# Patient Record
Sex: Male | Born: 1985 | Race: White | Hispanic: No | Marital: Single | Smoking: Current every day smoker
Health system: Southern US, Community
[De-identification: ages and names within clinical notes are randomized; demographics above are authoritative.]

## PROBLEM LIST (undated history)

## (undated) DIAGNOSIS — G40909 Epilepsy, unspecified, not intractable, without status epilepticus: Secondary | ICD-10-CM

## (undated) DIAGNOSIS — F909 Attention-deficit hyperactivity disorder, unspecified type: Secondary | ICD-10-CM

## (undated) DIAGNOSIS — F319 Bipolar disorder, unspecified: Secondary | ICD-10-CM

## (undated) DIAGNOSIS — J45909 Unspecified asthma, uncomplicated: Secondary | ICD-10-CM

## (undated) DIAGNOSIS — F639 Impulse disorder, unspecified: Secondary | ICD-10-CM

## (undated) HISTORY — PX: NO PAST SURGERIES: SHX2092

---
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
## 2006-05-02 ENCOUNTER — Ambulatory Visit: Payer: Self-pay | Admitting: Family Medicine

## 2006-10-30 DIAGNOSIS — F909 Attention-deficit hyperactivity disorder, unspecified type: Secondary | ICD-10-CM | POA: Insufficient documentation

## 2006-10-30 DIAGNOSIS — R569 Unspecified convulsions: Secondary | ICD-10-CM

## 2007-03-08 ENCOUNTER — Emergency Department (HOSPITAL_COMMUNITY): Admission: EM | Admit: 2007-03-08 | Discharge: 2007-03-08 | Payer: Self-pay | Admitting: Emergency Medicine

## 2007-04-13 ENCOUNTER — Emergency Department (HOSPITAL_COMMUNITY): Admission: EM | Admit: 2007-04-13 | Discharge: 2007-04-13 | Payer: Self-pay | Admitting: Emergency Medicine

## 2007-06-15 ENCOUNTER — Telehealth (INDEPENDENT_AMBULATORY_CARE_PROVIDER_SITE_OTHER): Payer: Self-pay | Admitting: *Deleted

## 2007-06-15 ENCOUNTER — Ambulatory Visit: Payer: Self-pay | Admitting: Family Medicine

## 2007-06-15 DIAGNOSIS — M25569 Pain in unspecified knee: Secondary | ICD-10-CM

## 2007-06-15 DIAGNOSIS — J301 Allergic rhinitis due to pollen: Secondary | ICD-10-CM | POA: Insufficient documentation

## 2007-12-05 ENCOUNTER — Emergency Department (HOSPITAL_COMMUNITY): Admission: EM | Admit: 2007-12-05 | Discharge: 2007-12-06 | Payer: Self-pay | Admitting: Emergency Medicine

## 2008-04-06 ENCOUNTER — Emergency Department (HOSPITAL_COMMUNITY): Admission: EM | Admit: 2008-04-06 | Discharge: 2008-04-07 | Payer: Self-pay | Admitting: Emergency Medicine

## 2008-06-15 ENCOUNTER — Emergency Department (HOSPITAL_COMMUNITY): Admission: EM | Admit: 2008-06-15 | Discharge: 2008-06-16 | Payer: Self-pay | Admitting: *Deleted

## 2009-05-20 ENCOUNTER — Encounter (INDEPENDENT_AMBULATORY_CARE_PROVIDER_SITE_OTHER): Payer: Self-pay | Admitting: *Deleted

## 2009-05-20 DIAGNOSIS — F17200 Nicotine dependence, unspecified, uncomplicated: Secondary | ICD-10-CM | POA: Insufficient documentation

## 2010-07-18 ENCOUNTER — Encounter: Payer: Self-pay | Admitting: Family Medicine

## 2010-10-02 NOTE — Miscellaneous (Signed)
  Clinical Lists Changes  Problems: Removed problem of ASTHMA, EXTRINSIC NOS (ICD-493.00)

## 2011-03-19 ENCOUNTER — Emergency Department (HOSPITAL_COMMUNITY)
Admission: EM | Admit: 2011-03-19 | Discharge: 2011-03-20 | Disposition: A | Discharge status: Home / Self Care | Payer: Self-pay | Attending: Emergency Medicine | Admitting: Emergency Medicine

## 2011-03-19 DIAGNOSIS — R112 Nausea with vomiting, unspecified: Secondary | ICD-10-CM | POA: Insufficient documentation

## 2011-03-19 DIAGNOSIS — R197 Diarrhea, unspecified: Secondary | ICD-10-CM | POA: Insufficient documentation

## 2011-03-20 LAB — DIFFERENTIAL
Basophils Absolute: 0 10*3/uL (ref 0.0–0.1)
Basophils Relative: 0 % (ref 0–1)
Eosinophils Absolute: 0.4 10*3/uL (ref 0.0–0.7)
Eosinophils Relative: 5 % (ref 0–5)
Lymphocytes Relative: 23 % (ref 12–46)
Lymphs Abs: 1.9 10*3/uL (ref 0.7–4.0)
Monocytes Absolute: 1.5 10*3/uL — ABNORMAL HIGH (ref 0.1–1.0)
Monocytes Relative: 19 % — ABNORMAL HIGH (ref 3–12)
Neutro Abs: 4.5 10*3/uL (ref 1.7–7.7)
Neutrophils Relative %: 54 % (ref 43–77)

## 2011-03-20 LAB — BASIC METABOLIC PANEL
BUN: 11 mg/dL (ref 6–23)
Chloride: 103 mEq/L (ref 96–112)
Creatinine, Ser: 0.75 mg/dL (ref 0.50–1.35)
Glucose, Bld: 101 mg/dL — ABNORMAL HIGH (ref 70–99)
Potassium: 3.7 mEq/L (ref 3.5–5.1)

## 2011-03-20 LAB — CBC
HCT: 43.4 % (ref 39.0–52.0)
Hemoglobin: 15.5 g/dL (ref 13.0–17.0)
MCV: 84.8 fL (ref 78.0–100.0)
WBC: 8.3 10*3/uL (ref 4.0–10.5)

## 2012-12-17 ENCOUNTER — Emergency Department (HOSPITAL_COMMUNITY): Payer: Self-pay

## 2012-12-17 ENCOUNTER — Encounter (HOSPITAL_COMMUNITY): Payer: Self-pay

## 2012-12-17 ENCOUNTER — Emergency Department (HOSPITAL_COMMUNITY)
Admission: EM | Admit: 2012-12-17 | Discharge: 2012-12-17 | Disposition: A | Discharge status: Home / Self Care | Payer: Self-pay | Attending: Emergency Medicine | Admitting: Emergency Medicine

## 2012-12-17 DIAGNOSIS — F99 Mental disorder, not otherwise specified: Secondary | ICD-10-CM | POA: Insufficient documentation

## 2012-12-17 DIAGNOSIS — R410 Disorientation, unspecified: Secondary | ICD-10-CM

## 2012-12-17 DIAGNOSIS — J029 Acute pharyngitis, unspecified: Secondary | ICD-10-CM | POA: Insufficient documentation

## 2012-12-17 DIAGNOSIS — R05 Cough: Secondary | ICD-10-CM

## 2012-12-17 DIAGNOSIS — Z9119 Patient's noncompliance with other medical treatment and regimen: Secondary | ICD-10-CM | POA: Insufficient documentation

## 2012-12-17 DIAGNOSIS — Z8659 Personal history of other mental and behavioral disorders: Secondary | ICD-10-CM | POA: Insufficient documentation

## 2012-12-17 DIAGNOSIS — J3489 Other specified disorders of nose and nasal sinuses: Secondary | ICD-10-CM | POA: Insufficient documentation

## 2012-12-17 DIAGNOSIS — F17200 Nicotine dependence, unspecified, uncomplicated: Secondary | ICD-10-CM | POA: Insufficient documentation

## 2012-12-17 DIAGNOSIS — R059 Cough, unspecified: Secondary | ICD-10-CM | POA: Insufficient documentation

## 2012-12-17 DIAGNOSIS — Z91199 Patient's noncompliance with other medical treatment and regimen due to unspecified reason: Secondary | ICD-10-CM | POA: Insufficient documentation

## 2012-12-17 DIAGNOSIS — J45909 Unspecified asthma, uncomplicated: Secondary | ICD-10-CM | POA: Insufficient documentation

## 2012-12-17 DIAGNOSIS — A630 Anogenital (venereal) warts: Secondary | ICD-10-CM | POA: Insufficient documentation

## 2012-12-17 DIAGNOSIS — R0981 Nasal congestion: Secondary | ICD-10-CM

## 2012-12-17 HISTORY — DX: Attention-deficit hyperactivity disorder, unspecified type: F90.9

## 2012-12-17 HISTORY — DX: Impulse disorder, unspecified: F63.9

## 2012-12-17 HISTORY — DX: Epilepsy, unspecified, not intractable, without status epilepticus: G40.909

## 2012-12-17 HISTORY — DX: Unspecified asthma, uncomplicated: J45.909

## 2012-12-17 HISTORY — DX: Bipolar disorder, unspecified: F31.9

## 2012-12-17 LAB — BASIC METABOLIC PANEL
BUN: 9 mg/dL (ref 6–23)
Calcium: 10.3 mg/dL (ref 8.4–10.5)
Creatinine, Ser: 0.77 mg/dL (ref 0.50–1.35)
GFR calc non Af Amer: >90 mL/min (ref >90)
Glucose, Bld: 156 mg/dL — ABNORMAL HIGH (ref 70–99)
Potassium: 4.3 mEq/L (ref 3.5–5.1)

## 2012-12-17 LAB — CBC WITH DIFFERENTIAL/PLATELET
Eosinophils Absolute: 0.3 10*3/uL (ref 0.0–0.7)
Eosinophils Relative: 5 % (ref 0–5)
HCT: 44.2 % (ref 39.0–52.0)
Hemoglobin: 16.1 g/dL (ref 13.0–17.0)
Lymphs Abs: 1.5 10*3/uL (ref 0.7–4.0)
MCH: 30.4 pg (ref 26.0–34.0)
MCV: 83.6 fL (ref 78.0–100.0)
Monocytes Absolute: 1.1 10*3/uL — ABNORMAL HIGH (ref 0.1–1.0)
Monocytes Relative: 15 % — ABNORMAL HIGH (ref 3–12)
RBC: 5.29 MIL/uL (ref 4.22–5.81)

## 2012-12-17 LAB — URINALYSIS, ROUTINE W REFLEX MICROSCOPIC
Bilirubin Urine: NEGATIVE
Hgb urine dipstick: NEGATIVE
Specific Gravity, Urine: 1.006 (ref 1.005–1.030)
Urobilinogen, UA: 0.2 mg/dL (ref 0.0–1.0)

## 2012-12-17 MED ORDER — BENZONATATE 100 MG PO CAPS
100.0000 mg | ORAL_CAPSULE | Freq: Once | ORAL | Status: AC
  Administered 2012-12-17: 100 mg via ORAL
  Filled 2012-12-17: qty 1

## 2012-12-17 MED ORDER — BENZONATATE 100 MG PO CAPS: 100.0000 mg | ORAL_CAPSULE | Freq: Two times a day (BID) | ORAL | Status: DC | PRN

## 2012-12-17 NOTE — ED Notes (Signed)
Pt. Does not feel comfortable taking off his shirt or pants to be placed in a gown.

## 2012-12-17 NOTE — ED Notes (Signed)
Pt. Believes he might have had a seizure thru the night.  Pt. Woke up disoriented  And believes that he had a seizure.  He also having upper resp. Symptoms, Stuffy nose and cough.

## 2012-12-17 NOTE — ED Notes (Signed)
Po fluids given, juice, Gatorade. And water.

## 2012-12-17 NOTE — ED Provider Notes (Signed)
History     CSN: [id number]  Arrival date & time 12/17/12  4098   First MD Initiated Contact with Patient 12/17/12 [phone]      Chief Complaint  Patient presents with  . Seizures    (Consider location/radiation/quality/duration/timing/severity/associated sxs/prior treatment) HPI Comments: 27 y M with PMH of rolandic sz (supposed to be on Neurontin, endorses non-compliance, impulsivity d/o, asthma. 15 pk year smoking hx, occasional marijuana use (denies other drugs) and allergies here for multiple complaints including nasal congestion, cough, sore throat and disorientation upon awakening this morning.  He reports that he thinks he had a seizure last night b/c this is the way he feels after his seizure.  No tongue biting or incontinence.  Event not witnessed.  URI sx began yesterday.  He has tried Ibuprofen and benadryl with some relief.  He also took Albuterol PTA.  + chills, no fevers.  No n/v, diarrhea, rash, dysuria.  Near the end of the exam he endorsed that he also has noticed genital warts.  Denies painful lesions, pus, dysuria, burning and other complaints.   Patient is a 27 y.o. male presenting with general illness. The history is provided by the patient.  Illness  The current episode started yesterday. The problem occurs continuously. The problem has been gradually worsening. The problem is mild. Associated symptoms include congestion, sore throat and cough. Pertinent negatives include no fever, no decreased vision, no double vision, no photophobia, no abdominal pain, no diarrhea, no nausea, no vomiting, no swollen glands, no neck pain, no neck stiffness, no rash and no eye pain. He has been eating and drinking normally.    Past Medical History  Diagnosis Date  . Epileptic seizures   . Asthma   . Manic depression   . Impulse control disorder   . ADHD (attention deficit hyperactivity disorder)     History reviewed. No pertinent past surgical history.  No family history on  file.  History  Substance Use Topics  . Smoking status: Current Every Day Smoker    Types: Cigarettes  . Smokeless tobacco: Never Used  . Alcohol Use: No      Review of Systems  Constitutional: Negative for fever.  HENT: Positive for congestion and sore throat. Negative for neck pain.   Eyes: Negative for double vision, photophobia and pain.  Respiratory: Positive for cough.   Gastrointestinal: Negative for nausea, vomiting, abdominal pain and diarrhea.  Skin: Negative for rash.  All other systems reviewed and are negative.    Allergies  Review of patient's allergies indicates no known allergies.  Home Medications   Current Outpatient Rx  Name  Route  Sig  Dispense  Refill  . albuterol (PROVENTIL) (2.5 MG/3ML) 0.083% nebulizer solution   Nebulization   Take 2.5 mg by nebulization every 6 (six) hours as needed for wheezing.         . diphenhydrAMINE (BENADRYL) 25 MG tablet   Oral   Take 50 mg by mouth every 6 (six) hours as needed for itching or allergies.         Marland Kitchen ibuprofen (ADVIL,MOTRIN) 200 MG tablet   Oral   Take 800 mg by mouth every 6 (six) hours as needed for pain.         . benzonatate (TESSALON) 100 MG capsule   Oral   Take 1 capsule (100 mg total) by mouth 2 (two) times daily as needed for cough.   20 capsule   0     BP 127/103  Pulse  117  Temp(Src) 98.7 F (37.1 C) (Oral)  Resp 20  SpO2 98%  Physical Exam  Vitals reviewed. Constitutional: He is oriented to person, place, and time. He appears well-developed and well-nourished. No distress.  HENT:  Head: Normocephalic.  Right Ear: External ear normal.  Left Ear: External ear normal.  Nose: Nose normal.  Mouth/Throat: Oropharynx is clear and moist. No oropharyngeal exudate.  Eyes: Conjunctivae and EOM are normal. Pupils are equal, round, and reactive to light.  Neck: Normal range of motion. Neck supple. No Brudzinski's sign and no Kernig's sign noted.  Cardiovascular: Normal rate,  regular rhythm, normal heart sounds and intact distal pulses.  Exam reveals no gallop and no friction rub.   No murmur heard. Pulmonary/Chest: Effort normal and breath sounds normal.  Abdominal: Soft. Bowel sounds are normal. He exhibits no distension. There is no tenderness.  Genitourinary: Testes normal.  RN present during exam Penis with verrucous lesions to right aspect, non painful, no ulcers, no pus, testes WNLs  Musculoskeletal: Normal range of motion. He exhibits no edema and no tenderness.  Neurological: He is alert and oriented to person, place, and time. No cranial nerve deficit.  Skin: Skin is warm and dry.  Psychiatric: He has a normal mood and affect.    ED Course  Procedures (including critical care time)  Labs Reviewed  CBC WITH DIFFERENTIAL - Abnormal; Notable for the following:    MCHC 36.4 (*)    Monocytes Relative 15 (*)    Monocytes Absolute 1.1 (*)    All other components within normal limits  BASIC METABOLIC PANEL - Abnormal; Notable for the following:    Glucose, Bld 156 (*)    All other components within normal limits  GC/CHLAMYDIA PROBE AMP  GC/CHLAMYDIA PROBE AMP  RPR  URINALYSIS, ROUTINE W REFLEX MICROSCOPIC   Dg Chest 2 View  12/17/2012  *RADIOLOGY REPORT*  Clinical Data: Cough and asthma  CHEST - 2 VIEW  Comparison: None.  Findings:  Lungs clear.  Heart size and pulmonary vascularity are normal.  No adenopathy.  No bone lesions.  IMPRESSION: No abnormality noted.   Original Report Authenticated By: Bretta Bang, M.D.      1. Cough   2. Nasal congestion   3. Transient disorientation   4. Condyloma acuminata       MDM   27 y M with PMH of rolandic sz (supposed to be on Neurontin, endorses non-compliance, impulsivity d/o, asthma. 15 pk year smoking hx, occasional marijuana use (denies other drugs) and allergies here for multiple complaints including nasal congestion, cough, sore throat and disorientation upon awakening this morning.   Disorientation resolved soon after awakening.  He reports that he thinks he had a seizure last night b/c this is the way he feels after his seizure.  No tongue biting or incontinence.  Event not witnessed.  URI sx began yesterday.  He has tried Ibuprofen and benadryl with some relief.  He also took Albuterol PTA.  + chills, no fevers.  No n/v, diarrhea, rash, dysuria.  Near the end of the exam he endorsed that he also has noticed genital warts.  Denies painful lesions, pus, dysuria, burning and other complaints.  AFVSS, mildly tachycardic.  Well appearing.  Intermittent cough.  Lungs clear.  Cardiac exam WNLs.  Abd soft.  No rash.  Penis with verrucoid lesions, no expressible pus or ulcers.  Pt with known seizure d/o that may have had a seizure in the setting of a URI.  Med compliance  encouraged.  No meningismus.  CBC/BMP.  Tachycardia likely 2/2 smoking and albuterol use.  Will PO hydrate.  Doubt PE as no chest pain, no DVT.  Cong/cough likely either URI or allergies.  Will obtain CXR to r/o PNA.  Will test for GC/chlam/RPR given his verrucoid penile lesions.  11:34 AM Labs WNLs.  CXR WNLs.  HR improved to around 100 with PO hydration.  Pt encouraged to continue PO hydration at home.  Urine sample sent to the labs.  Safe sex practices reviewed with the patient.  Best contact number = [phone].  OTC treatments for genital warts discussed. Return precautions reviewed.  It is felt the pt is stable for d/c with PCP f/u PRN.  All questions answered and patient expressed understanding.  Disposition: Discharge  Condition: Good  Follow-up Information   Follow up with Your regular doctor as discussed..        Medication List    TAKE these medications       benzonatate 100 MG capsule  Commonly known as:  TESSALON  Take 1 capsule (100 mg total) by mouth 2 (two) times daily as needed for cough.      Pt seen in conjunction with my attending, Dr. Manus Gunning.   Oleh Genin, MD PGY-II [facility]  Emergency Medicine Resident   Oleh Genin, MD 12/17/12 [phone]

## 2012-12-17 NOTE — ED Notes (Signed)
Pt. Informed RN that he does not want to have the GC/Chlamydia Probe done.

## 2012-12-17 NOTE — ED Provider Notes (Signed)
I saw and evaluated the patient, reviewed the resident's note and I agree with the findings and plan.  Disorientation after possible seizure overnight.  Now resolved. No tongue biting or incontinence. URI symptoms with cough.  No fever.  NO neuro deficits.  Glynn Octave, MD 12/17/12 [phone]

## 2015-04-17 ENCOUNTER — Emergency Department (HOSPITAL_COMMUNITY)
Admission: EM | Admit: 2015-04-17 | Discharge: 2015-04-17 | Discharge status: Absent without leave | Payer: Medicaid Other | Source: Home / Self Care

## 2017-01-03 ENCOUNTER — Emergency Department (HOSPITAL_COMMUNITY)
Admission: EM | Admit: 2017-01-03 | Discharge: 2017-01-03 | Disposition: A | Discharge status: Home / Self Care | Payer: Medicaid Other | Attending: Emergency Medicine | Admitting: Emergency Medicine

## 2017-01-03 ENCOUNTER — Encounter (HOSPITAL_COMMUNITY): Payer: Self-pay | Admitting: Emergency Medicine

## 2017-01-03 DIAGNOSIS — K2960 Other gastritis without bleeding: Secondary | ICD-10-CM | POA: Insufficient documentation

## 2017-01-03 DIAGNOSIS — F17210 Nicotine dependence, cigarettes, uncomplicated: Secondary | ICD-10-CM | POA: Diagnosis not present

## 2017-01-03 DIAGNOSIS — Z79899 Other long term (current) drug therapy: Secondary | ICD-10-CM | POA: Diagnosis not present

## 2017-01-03 DIAGNOSIS — F909 Attention-deficit hyperactivity disorder, unspecified type: Secondary | ICD-10-CM | POA: Insufficient documentation

## 2017-01-03 DIAGNOSIS — J45909 Unspecified asthma, uncomplicated: Secondary | ICD-10-CM | POA: Diagnosis not present

## 2017-01-03 DIAGNOSIS — K2900 Acute gastritis without bleeding: Secondary | ICD-10-CM

## 2017-01-03 DIAGNOSIS — R1012 Left upper quadrant pain: Secondary | ICD-10-CM | POA: Diagnosis present

## 2017-01-03 LAB — CBC WITH DIFFERENTIAL/PLATELET
Basophils Absolute: 0 10*3/uL (ref 0.0–0.1)
Basophils Relative: 0 %
EOS PCT: 4 %
Eosinophils Absolute: 0.3 10*3/uL (ref 0.0–0.7)
HCT: 42.8 % (ref 39.0–52.0)
Hemoglobin: 14.2 g/dL (ref 13.0–17.0)
LYMPHS ABS: 3.2 10*3/uL (ref 0.7–4.0)
LYMPHS PCT: 41 %
MCH: 28.6 pg (ref 26.0–34.0)
MCHC: 33.2 g/dL (ref 30.0–36.0)
MCV: 86.3 fL (ref 78.0–100.0)
MONO ABS: 0.5 10*3/uL (ref 0.1–1.0)
MONOS PCT: 7 %
Neutro Abs: 3.8 10*3/uL (ref 1.7–7.7)
Neutrophils Relative %: 48 %
Platelets: 161 10*3/uL (ref 150–400)
RBC: 4.96 MIL/uL (ref 4.22–5.81)
RDW: 13.3 % (ref 11.5–15.5)
WBC: 7.9 10*3/uL (ref 4.0–10.5)

## 2017-01-03 LAB — URINALYSIS, ROUTINE W REFLEX MICROSCOPIC
BILIRUBIN URINE: NEGATIVE
Glucose, UA: NEGATIVE mg/dL
HGB URINE DIPSTICK: NEGATIVE
Ketones, ur: NEGATIVE mg/dL
Leukocytes, UA: NEGATIVE
Nitrite: NEGATIVE
PROTEIN: NEGATIVE mg/dL
Specific Gravity, Urine: 1.03 (ref 1.005–1.030)
pH: 6 (ref 5.0–8.0)

## 2017-01-03 LAB — COMPREHENSIVE METABOLIC PANEL
ALT: 34 U/L (ref 17–63)
AST: 24 U/L (ref 15–41)
Albumin: 4 g/dL (ref 3.5–5.0)
Alkaline Phosphatase: 74 U/L (ref 38–126)
Anion gap: 6 (ref 5–15)
BILIRUBIN TOTAL: 0.4 mg/dL (ref 0.3–1.2)
BUN: 10 mg/dL (ref 6–20)
CO2: 27 mmol/L (ref 22–32)
CREATININE: 0.82 mg/dL (ref 0.61–1.24)
Calcium: 9.3 mg/dL (ref 8.9–10.3)
Chloride: 107 mmol/L (ref 101–111)
GFR calc Af Amer: >60 mL/min (ref >60)
Glucose, Bld: 90 mg/dL (ref 65–99)
Potassium: 4 mmol/L (ref 3.5–5.1)
Sodium: 140 mmol/L (ref 135–145)
TOTAL PROTEIN: 6.8 g/dL (ref 6.5–8.1)

## 2017-01-03 LAB — LIPASE, BLOOD: Lipase: 19 U/L (ref 11–51)

## 2017-01-03 MED ORDER — RANITIDINE HCL 150 MG PO CAPS: 150.0000 mg | ORAL_CAPSULE | Freq: Every day | ORAL | 0 refills | Status: DC

## 2017-01-03 MED ORDER — GI COCKTAIL ~~LOC~~
30.0000 mL | Freq: Once | ORAL | Status: AC
  Administered 2017-01-03: 30 mL via ORAL
  Filled 2017-01-03: qty 30

## 2017-01-03 NOTE — ED Notes (Signed)
ED Provider at bedside. 

## 2017-01-03 NOTE — ED Provider Notes (Signed)
[facility]-EMERGENCY DEPT Provider Note   CSN: [id number] Arrival date & time: 01/03/17  0907     History   Chief Complaint Chief Complaint  Patient presents with  . Abdominal Pain    HPI Wesley Clarke is a 31 y.o. male.  The history is provided by the patient.  Abdominal Pain   This is a new problem. Episode onset: 1 week ago. Episode frequency: intermittent. The problem has not changed since onset.The pain is associated with sick contacts (with gastroenteritis sx). The pain is located in the LUQ. Associated symptoms include vomiting (last week; now resolved). Pertinent negatives include anorexia, fever, diarrhea, nausea, constipation and dysuria. The symptoms are aggravated by eating. Nothing relieves the symptoms.    Past Medical History:  Diagnosis Date  . ADHD (attention deficit hyperactivity disorder)   . Asthma   . Epileptic seizures (HCC)   . Impulse control disorder   . 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HCC)     Patient Active Problem List   Diagnosis Date Noted  . TOBACCO USER 05/20/2009  . RHINITIS, ALLERGIC, DUE TO POLLEN 06/15/2007  . 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 ATTENTION DEFICIT, W/HYPERACTIVITY 10/30/2006  . CONVULSIONS, SEIZURES, NOS 10/30/2006    History reviewed. No pertinent surgical history.     Home Medications    Prior to Admission medications   Medication Sig Start Date End Date Taking? Authorizing Provider  ibuprofen (ADVIL,MOTRIN) 200 MG tablet Take 800 mg by mouth every 6 (six) hours as needed for pain.   Yes Historical Provider, MD  benzonatate (TESSALON) 100 MG capsule Take 1 capsule (100 mg total) by mouth 2 (two) times daily as needed for cough. Patient not taking: Reported on 01/03/2017 12/17/12   Oleh Geninasey Bryant, MD  ranitidine (ZANTAC) 150 MG capsule Take 1 capsule (150 mg total) by mouth daily. 01/03/17   Nira ConnPedro Eduardo Cardama, MD    Family History No family history on file.  Social History Social History  Substance Use Topics  . Smoking  status: Current Every Day Smoker    Types: Cigarettes  . Smokeless tobacco: Never Used  . Alcohol use No     Allergies   Patient has no known allergies.   Review of Systems Review of Systems  Constitutional: Negative for fever.  Gastrointestinal: Positive for abdominal pain and vomiting (last week; now resolved). Negative for anorexia, constipation, diarrhea and nausea.  Genitourinary: Negative for dysuria.  All other systems are reviewed and are negative for acute change except as noted in the HPI    Physical Exam Updated Vital Signs BP (!) 148/108   Pulse 82   Temp 98.2 F (36.8 C) (Oral)   Resp 14   SpO2 97%   Physical Exam  Constitutional: He is oriented to person, place, and time. He appears well-developed and well-nourished. No distress.  HENT:  Head: Normocephalic and atraumatic.  Nose: Nose normal.  Eyes: Conjunctivae and EOM are normal. Pupils are equal, round, and reactive to light. Right eye exhibits no discharge. Left eye exhibits no discharge. No scleral icterus.  Neck: Normal range of motion. Neck supple.  Cardiovascular: Normal rate and regular rhythm.  Exam reveals no gallop and no friction rub.   No murmur heard. Pulmonary/Chest: Effort normal and breath sounds normal. No stridor. No respiratory distress. He has no rales.  Abdominal: Soft. He exhibits no distension. There is tenderness in the left upper quadrant. There is no rigidity, no rebound, no guarding and no CVA tenderness.  Musculoskeletal: He exhibits no edema or tenderness.  Neurological: He is alert and oriented to person, place, and time.  Skin: Skin is warm and dry. No rash noted. He is not diaphoretic. No erythema.  Psychiatric: He has a normal mood and affect.  Vitals reviewed.    ED Treatments / Results  Labs (all labs ordered are listed, but only abnormal results are displayed) Labs Reviewed  LIPASE, BLOOD  COMPREHENSIVE METABOLIC PANEL  URINALYSIS, ROUTINE W REFLEX MICROSCOPIC    CBC WITH DIFFERENTIAL/PLATELET    EKG  EKG Interpretation None       Radiology No results found.  Procedures Procedures (including critical care time)  Medications Ordered in ED Medications  gi cocktail (Maalox,Lidocaine,Donnatal) (30 mLs Oral Given 01/03/17 1105)     Initial Impression / Assessment and Plan / ED Course  I have reviewed the triage vital signs and the nursing notes.  Pertinent labs & imaging results that were available during my care of the patient were reviewed by me and considered in my medical decision making (see chart for details).     Workup most consistent with likely gastritis. Abdomen nonpertinent headache. Labs grossly reassuring without evidence of pancreatitis or biliary obstruction. Significant improvement of patient's pain following a GI cocktail. Low suspicion for cardiac etiology and no suspicion for any other emergent etiologies at this time. Patient able to tolerate by mouth intake here.  The patient is safe for discharge with strict return precautions.  Final Clinical Impressions(s) / ED Diagnoses   Final diagnoses:  Other acute gastritis without hemorrhage   Disposition: Discharge  Condition: Good  I have discussed the results, Dx and Tx plan with the patient who expressed understanding and agree(s) with the plan. Discharge instructions discussed at great length. The patient was given strict return precautions who verbalized understanding of the instructions. No further questions at time of discharge.    New Prescriptions   RANITIDINE (ZANTAC) 150 MG CAPSULE    Take 1 capsule (150 mg total) by mouth daily.    Follow Up: primary care provider  Schedule an appointment as soon as possible for a visit  As needed      Nira Conn, MD 01/03/17 [phone]

## 2017-01-03 NOTE — ED Triage Notes (Signed)
Pt reports LLQ pain x1 week when he wakes up in the morning and after he eats lunch. Denies diarrhea but reports 1 episode of vomiting today.

## 2017-02-23 ENCOUNTER — Encounter (HOSPITAL_COMMUNITY): Payer: Self-pay

## 2017-02-23 DIAGNOSIS — X58XXXA Exposure to other specified factors, initial encounter: Secondary | ICD-10-CM | POA: Insufficient documentation

## 2017-02-23 DIAGNOSIS — S29012A Strain of muscle and tendon of back wall of thorax, initial encounter: Secondary | ICD-10-CM | POA: Diagnosis not present

## 2017-02-23 DIAGNOSIS — Y939 Activity, unspecified: Secondary | ICD-10-CM | POA: Diagnosis not present

## 2017-02-23 DIAGNOSIS — Y929 Unspecified place or not applicable: Secondary | ICD-10-CM | POA: Diagnosis not present

## 2017-02-23 DIAGNOSIS — F909 Attention-deficit hyperactivity disorder, unspecified type: Secondary | ICD-10-CM | POA: Diagnosis not present

## 2017-02-23 DIAGNOSIS — S3992XA Unspecified injury of lower back, initial encounter: Secondary | ICD-10-CM | POA: Diagnosis present

## 2017-02-23 DIAGNOSIS — F17210 Nicotine dependence, cigarettes, uncomplicated: Secondary | ICD-10-CM | POA: Diagnosis not present

## 2017-02-23 DIAGNOSIS — Y999 Unspecified external cause status: Secondary | ICD-10-CM | POA: Insufficient documentation

## 2017-02-23 DIAGNOSIS — R062 Wheezing: Secondary | ICD-10-CM | POA: Insufficient documentation

## 2017-02-23 NOTE — ED Triage Notes (Signed)
Pt complaining of L shoulder pain. Pt denies any cough or SOB. Pt denies any chest pain or L arm pain. Pt states onset yesterday.

## 2017-02-24 ENCOUNTER — Emergency Department (HOSPITAL_COMMUNITY): Payer: Medicaid Other

## 2017-02-24 ENCOUNTER — Emergency Department (HOSPITAL_COMMUNITY)
Admission: EM | Admit: 2017-02-24 | Discharge: 2017-02-24 | Disposition: A | Discharge status: Home / Self Care | Payer: Medicaid Other | Attending: Emergency Medicine | Admitting: Emergency Medicine

## 2017-02-24 ENCOUNTER — Other Ambulatory Visit: Payer: Self-pay

## 2017-02-24 DIAGNOSIS — R062 Wheezing: Secondary | ICD-10-CM

## 2017-02-24 DIAGNOSIS — T148XXA Other injury of unspecified body region, initial encounter: Secondary | ICD-10-CM

## 2017-02-24 LAB — BASIC METABOLIC PANEL
ANION GAP: 6 (ref 5–15)
BUN: 10 mg/dL (ref 6–20)
CALCIUM: 9.8 mg/dL (ref 8.9–10.3)
CO2: 26 mmol/L (ref 22–32)
Chloride: 107 mmol/L (ref 101–111)
Creatinine, Ser: 0.68 mg/dL (ref 0.61–1.24)
GFR calc Af Amer: >60 mL/min (ref >60)
GFR calc non Af Amer: >60 mL/min (ref >60)
GLUCOSE: 109 mg/dL — AB (ref 65–99)
Potassium: 3.6 mmol/L (ref 3.5–5.1)
Sodium: 139 mmol/L (ref 135–145)

## 2017-02-24 LAB — CBC
HCT: 46.6 % (ref 39.0–52.0)
Hemoglobin: 15.4 g/dL (ref 13.0–17.0)
MCH: 29 pg (ref 26.0–34.0)
MCHC: 33 g/dL (ref 30.0–36.0)
MCV: 87.8 fL (ref 78.0–100.0)
Platelets: 179 10*3/uL (ref 150–400)
RBC: 5.31 MIL/uL (ref 4.22–5.81)
RDW: 13.2 % (ref 11.5–15.5)
WBC: 12 10*3/uL — ABNORMAL HIGH (ref 4.0–10.5)

## 2017-02-24 LAB — I-STAT TROPONIN, ED: Troponin i, poc: 0 ng/mL (ref 0.00–0.08)

## 2017-02-24 MED ORDER — HYDROCODONE-ACETAMINOPHEN 5-325 MG PO TABS
1.0000 | ORAL_TABLET | Freq: Once | ORAL | Status: AC
  Administered 2017-02-24: 1 via ORAL
  Filled 2017-02-24: qty 1

## 2017-02-24 MED ORDER — IBUPROFEN 600 MG PO TABS: 600.0000 mg | ORAL_TABLET | Freq: Three times a day (TID) | ORAL | 0 refills | Status: DC | PRN

## 2017-02-24 MED ORDER — IPRATROPIUM-ALBUTEROL 0.5-2.5 (3) MG/3ML IN SOLN
3.0000 mL | Freq: Once | RESPIRATORY_TRACT | Status: AC
  Administered 2017-02-24: 3 mL via RESPIRATORY_TRACT
  Filled 2017-02-24: qty 3

## 2017-02-24 NOTE — ED Notes (Signed)
Pt understood dc material. NAD noted. Script given at dc  

## 2017-02-24 NOTE — ED Provider Notes (Signed)
[facility]-EMERGENCY DEPT Provider Note   CSN: [id number] Arrival date & time: 02/23/17  2326  By signing my name below, I, Phillips Climes, attest that this documentation has been prepared under the direction and in the presence of Zadie Rhine, MD . Electronically Signed: Phillips Climes, Scribe. 02/24/2017. 3:52 AM.   History   Chief Complaint Chief Complaint  Patient presents with  . Back Pain  . Chest Pain    HPI Comments Wesley Clarke is a 31 y.o. male with a PMHx significant for ADHD, asthma, epilepsy, impulse control disorder and depression, who presents to the Emergency Department with complaints of sudden onset back pain, worse with inhalation, x1 day.  Pt states that pain is isolated to under his left shoulder blade.  No inciting event noted.  Pt denies any heavy lifting or exertion.  Sx began yesterday and resolved with no intervention, then returned worse today.  Sx have been constant since this morning, currently rated a 10/10 in severity. No fevers, vomiting.  No hemoptysis, abdominal pain or LE edema.  No recent periods of immobilization. No cardiac hx.  No hx of PE/DVT.  No recent seizures, falls or injuries.  Pt is a current smoker.  Sx not resolved by breathing treatment, used PTA.  Pt has not attempted any OTC pain management before presenting today for evaluation.  Thinks it might be work related.  He works outside with heavy lifting.  Reports he was told never to work outside when temps>80 degrees  The history is provided by the patient and medical records. No language interpreter was used.  Back Pain   This is a new problem. The current episode started yesterday. The problem occurs constantly. The problem has been gradually worsening. The pain is associated with no known injury. The quality of the pain is described as stabbing. The pain does not radiate. The pain is at a severity of 10/10. The pain is severe. The symptoms are aggravated by certain positions. The pain  is the same all the time. Pertinent negatives include no chest pain, no fever and no abdominal pain. He has tried nothing for the symptoms.   Past Medical History:  Diagnosis Date  . ADHD (attention deficit hyperactivity disorder)   . Asthma   . Epileptic seizures (HCC)   . Impulse control disorder   . 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HCC)     Patient Active Problem List   Diagnosis Date Noted  . TOBACCO USER 05/20/2009  . RHINITIS, ALLERGIC, DUE TO POLLEN 06/15/2007  . 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 ATTENTION DEFICIT, W/HYPERACTIVITY 10/30/2006  . CONVULSIONS, SEIZURES, NOS 10/30/2006    History reviewed. No pertinent surgical history.     Home Medications    Prior to Admission medications   Medication Sig Start Date End Date Taking? Authorizing Provider  benzonatate (TESSALON) 100 MG capsule Take 1 capsule (100 mg total) by mouth 2 (two) times daily as needed for cough. Patient not taking: Reported on 01/03/2017 12/17/12   Oleh Genin, MD  ibuprofen (ADVIL,MOTRIN) 200 MG tablet Take 800 mg by mouth every 6 (six) hours as needed for pain.    [provider]  ranitidine (ZANTAC) 150 MG capsule Take 1 capsule (150 mg total) by mouth daily. 01/03/17   Nira Conn, MD    Family History History reviewed. No pertinent family history.  Social History Social History  Substance Use Topics  . Smoking status: Current Every Day Smoker    Types: Cigarettes  . Smokeless tobacco: Never  Used  . Alcohol use No     Allergies   Patient has no known allergies.   Review of Systems Review of Systems  Constitutional: Negative for fever.  Respiratory: Negative for cough.   Cardiovascular: Negative for chest pain and leg swelling.  Gastrointestinal: Negative for abdominal pain and vomiting.  Musculoskeletal: Positive for back pain.  All other systems reviewed and are negative.  Physical Exam Updated Vital Signs BP (!) 151/127 (BP Location: Left Arm)   Pulse 85    Temp 98.2 F (36.8 C) (Oral)   Resp 16   SpO2 100%   Physical Exam CONSTITUTIONAL: Well developed/well nourished HEAD: Normocephalic/atraumatic EYES: EOMI/PERRL ENMT: Mucous membranes moist NECK: supple no meningeal signs SPINE/BACK:entire spine nontender CV: S1/S2 noted, no murmurs/rubs/gallops noted LUNGS: Wheezing bilaterally. ABDOMEN: soft, nontender NEURO: Pt is awake/alert/appropriate, moves all extremitiesx4.  No facial droop.   EXTREMITIES: pulses normal/equal, full ROM. Pinpoint tenderness to left scapula, no erythema/bruising noted. FROM of bilateral shoulders.  No shoulder tenderness.  No calf tenderness or edema noted.  SKIN: warm, color normal PSYCH: no abnormalities of mood noted, alert and oriented to situation  ED Treatments / Results  DIAGNOSTIC STUDIES: Oxygen Saturation is 100% on room air, normal by my interpretation.    COORDINATION OF CARE: 3:39 AM Discussed treatment plan with pt at bedside and pt agreed to plan.  Labs (all labs ordered are listed, but only abnormal results are displayed) Labs Reviewed  BASIC METABOLIC PANEL - Abnormal; Notable for the following:       Result Value   Glucose, Bld 109 (*)    All other components within normal limits  CBC - Abnormal; Notable for the following:    WBC 12.0 (*)    All other components within normal limits  I-STAT TROPOININ, ED    EKG  EKG Interpretation  Date/Time:  Monday February 24 2017 04:36:06 EDT Ventricular Rate:  86 PR Interval:    QRS Duration: 86 QT Interval:  352 QTC Calculation: 421 R Axis:   33 Text Interpretation:  Sinus rhythm No previous ECGs available Confirmed by Zadie RhineWickline, Lachell Rochette ([id number]) on 02/24/2017 4:38:47 AM       Radiology Dg Chest 2 View  Result Date: 02/24/2017 CLINICAL DATA:  LEFT shoulder pain and shortness of breath beginning yesterday. History of asthma. EXAM: CHEST  2 VIEW COMPARISON:  Chest radiograph December 17, 2012 FINDINGS: Cardiomediastinal silhouette is normal.  No pleural effusions or focal consolidations. Mild bronchitic changes strandy densities LEFT lung base. Trachea projects midline and there is no pneumothorax. Soft tissue planes and included osseous structures are non-suspicious. IMPRESSION: Mild bronchitic changes and LEFT lung base atelectasis. Electronically Signed   By: Awilda Metroourtnay  Bloomer M.D.   On: 02/24/2017 00:16    Procedures Procedures   Medications Ordered in ED Medications  ipratropium-albuterol (DUONEB) 0.5-2.5 (3) MG/3ML nebulizer solution 3 mL (3 mLs Nebulization Given 02/24/17 0347)  HYDROcodone-acetaminophen (NORCO/VICODIN) 5-325 MG per tablet 1 tablet (1 tablet Oral Given 02/24/17 0347)     Initial Impression / Assessment and Plan / ED Course  I have reviewed the triage vital signs and the nursing notes.  Pertinent labs & imaging results that were available during my care of the patient were reviewed by me and considered in my medical decision making (see chart for details).     Pt stable Wheeze resolved after nebs Pain improved He still has pinpoint tenderness to palpation I doubt PE at this time Advised to quit smoking Needs 2 days off  work, but for all other concerns (states he can't work outside when [employer]) he needs PCP management   I personally performed the services described in this documentation, which was scribed in my presence. The recorded information has been reviewed and is accurate.    Final Clinical Impressions(s) / ED Diagnoses   Final diagnoses:  Muscle strain  Wheezing    New Prescriptions New Prescriptions   IBUPROFEN (ADVIL,MOTRIN) 600 MG TABLET    Take 1 tablet (600 mg total) by mouth every 8 (eight) hours as needed.     Zadie Rhine, MD 02/24/17 [phone]

## 2017-11-24 ENCOUNTER — Encounter (HOSPITAL_COMMUNITY): Payer: Self-pay | Admitting: Emergency Medicine

## 2017-11-24 ENCOUNTER — Ambulatory Visit (HOSPITAL_COMMUNITY)
Admission: EM | Admit: 2017-11-24 | Discharge: 2017-11-24 | Disposition: A | Discharge status: Home / Self Care | Payer: Medicaid Other | Attending: Family Medicine | Admitting: Family Medicine

## 2017-11-24 DIAGNOSIS — S39012A Strain of muscle, fascia and tendon of lower back, initial encounter: Secondary | ICD-10-CM

## 2017-11-24 MED ORDER — NAPROXEN 500 MG PO TABS: 500.0000 mg | ORAL_TABLET | Freq: Two times a day (BID) | ORAL | 0 refills | Status: DC

## 2017-11-24 MED ORDER — KETOROLAC TROMETHAMINE 60 MG/2ML IM SOLN
INTRAMUSCULAR | Status: AC
  Filled 2017-11-24: qty 2

## 2017-11-24 MED ORDER — KETOROLAC TROMETHAMINE 60 MG/2ML IM SOLN
60.0000 mg | Freq: Once | INTRAMUSCULAR | Status: AC
  Administered 2017-11-24: 60 mg via INTRAMUSCULAR

## 2017-11-24 MED ORDER — PREDNISONE 10 MG (21) PO TBPK: ORAL_TABLET | Freq: Every day | ORAL | 0 refills | Status: DC

## 2017-11-24 NOTE — Discharge Instructions (Signed)
Naproxen twice a day, do not take first dose until tonight. Take with food. Don't take additional ibuprofen with this.  Light and regular activity.  Limit weight lifting to <25 lbs for the next three days. See provided exercises. Please follow up with your primary care provider for recheck in the next 2-3 weeks as may need long term management if symptoms persist.

## 2017-11-24 NOTE — ED Triage Notes (Signed)
Pt sts lower back pain x 1 week; pt sts worse with sitting

## 2017-11-24 NOTE — ED Provider Notes (Signed)
[facility]    CSN: [id number] Arrival date & time: 11/24/17  1009     History   Chief Complaint Chief Complaint  Patient presents with  . Back Pain    HPI Wesley Clarke is a 32 y.o. male.   Wesley Clarke presents with complaints of mid and low back pain which started 3/19 after he had been lifting at work. Pain is constant but worse with sitting straight upright. Improved with laying on stomach or standing. Rates pain 7/10. This morning when he woke up he felt numbness to his left leg which improved in approximately 5 minutes. Without any further numbness or tingling. Ambulatory. Denies any urinary or stool incontinence. Without any saddle paresthesias. States has had similar in the past but typically resolves sooner. Has not taken any medications for pain today. Previously has tried ibuprofen which minimally helps. Does not take any medications regularly.    ROS per HPI.      Past Medical History:  Diagnosis Date  . ADHD (attention deficit hyperactivity disorder)   . Asthma   . Epileptic seizures (HCC)   . Impulse control disorder   . 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HCC)     Patient Active Problem List   Diagnosis Date Noted  . TOBACCO USER 05/20/2009  . RHINITIS, ALLERGIC, DUE TO POLLEN 06/15/2007  . 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 ATTENTION DEFICIT, W/HYPERACTIVITY 10/30/2006  . CONVULSIONS, SEIZURES, NOS 10/30/2006    History reviewed. No pertinent surgical history.     Home Medications    Prior to Admission medications   Medication Sig Start Date End Date Taking? Authorizing Provider  benzonatate (TESSALON) 100 MG capsule Take 1 capsule (100 mg total) by mouth 2 (two) times daily as needed for cough. Patient not taking: Reported on 01/03/2017 12/17/12   Oleh Genin, MD  ibuprofen (ADVIL,MOTRIN) 600 MG tablet Take 1 tablet (600 mg total) by mouth every 8 (eight) hours as needed. 02/24/17   Zadie Rhine, MD  naproxen (NAPROSYN) 500 MG tablet Take 1  tablet (500 mg total) by mouth 2 (two) times daily. 11/24/17   Georgetta Haber, NP  predniSONE (STERAPRED UNI-PAK 21 TAB) 10 MG (21) TBPK tablet Take by mouth daily. Take 6 tabs by mouth daily  for 2 days, then 5 tabs for 2 days, then 4 tabs for 2 days, then 3 tabs for 2 days, 2 tabs for 2 days, then 1 tab by mouth daily for 2 days 11/24/17   Linus Mako B, NP  ranitidine (ZANTAC) 150 MG capsule Take 1 capsule (150 mg total) by mouth daily. 01/03/17   Nira Conn, MD    Family History History reviewed. No pertinent family history.  Social History Social History   Tobacco Use  . Smoking status: Current Every Day Smoker    Types: Cigarettes  . Smokeless tobacco: Never Used  Substance Use Topics  . Alcohol use: No  . Drug use: Yes    Types: Marijuana     Allergies   Patient has no known allergies.   Review of Systems Review of Systems   Physical Exam Triage Vital Signs ED Triage Vitals [11/24/17 1052]  Enc Vitals Group     BP (!) 152/84     Pulse Rate 96     Resp 18     Temp 98.4 F (36.9 C)     Temp Source Oral     SpO2 97 %     Weight      Height  Head Circumference      Peak Flow      Pain Score      Pain Loc      Pain Edu?      Excl. in GC?    No data found.  Updated Vital Signs BP (!) 152/84 (BP Location: Right Arm)   Pulse 96   Temp 98.4 F (36.9 C) (Oral)   Resp 18   SpO2 97%   Visual Acuity Right Eye Distance:   Left Eye Distance:   Bilateral Distance:    Right Eye Near:   Left Eye Near:    Bilateral Near:     Physical Exam  Constitutional: He is oriented to person, place, and time. He appears well-developed and well-nourished.  Cardiovascular: Normal rate and regular rhythm.  Pulmonary/Chest: Effort normal and breath sounds normal.  Musculoskeletal:       Lumbar back: He exhibits tenderness, bony tenderness and pain. He exhibits normal range of motion, no swelling, no edema, no deformity, no laceration, no spasm and normal  pulse.       Back:  Patient sitting with legs up on table and crossed in pretzel position throughout exam, without acute distress or discomfort; ambulatory without difficulty; tenderness without step off; equal reflexes to lower extremities, strength equal and sensation intact; without pain with toe touch weight bearing; pain with bilateral hip flexion and straight leg raises without radiation of pain down legs  Neurological: He is alert and oriented to person, place, and time.  Skin: Skin is warm and dry.     UC Treatments / Results  Labs (all labs ordered are listed, but only abnormal results are displayed) Labs Reviewed - No data to display  EKG None Radiology No results found.  Procedures Procedures (including critical care time)  Medications Ordered in UC Medications  ketorolac (TORADOL) injection 60 mg (has no administration in time range)     Initial Impression / Assessment and Plan / UC Course  I have reviewed the triage vital signs and the nursing notes.  Pertinent labs & imaging results that were available during my care of the patient were reviewed by me and considered in my medical decision making (see chart for details).     toradol in clinic today, prednisone pack provided. Limit weight x3 days. Encouraged follow up with PCP in the next 1-2 weeks for recheck and further management as needed. Patient verbalized understanding and agreeable to plan. Return precautions provided.  Ambulatory out of clinic without difficulty.    Final Clinical Impressions(s) / UC Diagnoses   Final diagnoses:  Strain of lumbar region, initial encounter    ED Discharge Orders        Ordered    naproxen (NAPROSYN) 500 MG tablet  2 times daily     11/24/17 1142    predniSONE (STERAPRED UNI-PAK 21 TAB) 10 MG (21) TBPK tablet  Daily     11/24/17 1142       Controlled Substance Prescriptions [place] Controlled Substance Registry consulted? Not Applicable   Georgetta HaberBurky, Kadin Canipe B,  NP 11/24/17 1153

## 2018-03-02 ENCOUNTER — Ambulatory Visit (HOSPITAL_COMMUNITY)
Admission: EM | Admit: 2018-03-02 | Discharge: 2018-03-02 | Disposition: A | Discharge status: Home / Self Care | Payer: Medicaid Other | Attending: Family Medicine | Admitting: Family Medicine

## 2018-03-02 ENCOUNTER — Encounter (HOSPITAL_COMMUNITY): Payer: Self-pay

## 2018-03-02 DIAGNOSIS — M940 Chondrocostal junction syndrome [Tietze]: Secondary | ICD-10-CM | POA: Diagnosis not present

## 2018-03-02 MED ORDER — NAPROXEN 500 MG PO TABS: 500.0000 mg | ORAL_TABLET | Freq: Two times a day (BID) | ORAL | 0 refills | Status: DC

## 2018-03-02 NOTE — ED Provider Notes (Signed)
[facility]    CSN: [id number] Arrival date & time: 03/02/18  1313     History   Chief Complaint Chief Complaint  Patient presents with  . left lung pain    HPI Wesley Clarke is a 32 y.o. male.   Wesley Clarke presents with complaints of left sided rib pain which he woke with this morning. States he has had similar in the past and was tole he had "lung inflammation." pain worse with deep inspiration or with stretching of the left lateral rib cage. Cough this morning which has improved. No shortness of breath. No fevers or URI symptoms. Cough was productive. Has not taken any medications today for symptoms. No injury. Went swimming yesterday without any difficulty. History of asthma, has not used any of his inhalers. Smokes 0.5ppd. No palpitations, nausea, jaw or arm pain. No cardiac history. History of adhd, asthma, seizures.    ROS per HPI.      Past Medical History:  Diagnosis Date  . ADHD (attention deficit hyperactivity disorder)   . Asthma   . Epileptic seizures (HCC)   . Impulse control disorder   . 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HCC)     Patient Active Problem List   Diagnosis Date Noted  . TOBACCO USER 05/20/2009  . RHINITIS, ALLERGIC, DUE TO POLLEN 06/15/2007  . 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 ATTENTION DEFICIT, W/HYPERACTIVITY 10/30/2006  . CONVULSIONS, SEIZURES, NOS 10/30/2006    History reviewed. No pertinent surgical history.     Home Medications    Prior to Admission medications   Medication Sig Start Date End Date Taking? Authorizing Provider  ibuprofen (ADVIL,MOTRIN) 600 MG tablet Take 1 tablet (600 mg total) by mouth every 8 (eight) hours as needed. 02/24/17  Yes Zadie RhineWickline, Donald, MD  benzonatate (TESSALON) 100 MG capsule Take 1 capsule (100 mg total) by mouth 2 (two) times daily as needed for cough. Patient not taking: Reported on 01/03/2017 12/17/12   Oleh GeninBryant, Casey, MD  naproxen (NAPROSYN) 500 MG tablet Take 1 tablet (500 mg total) by mouth 2  (two) times daily. 03/02/18   Georgetta HaberBurky, Amiliah Campisi B, NP  predniSONE (STERAPRED UNI-PAK 21 TAB) 10 MG (21) TBPK tablet Take by mouth daily. Take 6 tabs by mouth daily  for 2 days, then 5 tabs for 2 days, then 4 tabs for 2 days, then 3 tabs for 2 days, 2 tabs for 2 days, then 1 tab by mouth daily for 2 days 11/24/17   Linus MakoBurky, Sarkis Rhines B, NP  ranitidine (ZANTAC) 150 MG capsule Take 1 capsule (150 mg total) by mouth daily. 01/03/17   Nira Connardama, Pedro Eduardo, MD    Family History History reviewed. No pertinent family history.  Social History Social History   Tobacco Use  . Smoking status: Current Every Day Smoker    Types: Cigarettes  . Smokeless tobacco: Never Used  Substance Use Topics  . Alcohol use: No  . Drug use: Yes    Types: Marijuana     Allergies   Patient has no known allergies.   Review of Systems Review of Systems   Physical Exam Triage Vital Signs ED Triage Vitals  Enc Vitals Group     BP 03/02/18 1359 (!) 150/91     Pulse Rate 03/02/18 1359 100     Resp 03/02/18 1359 20     Temp 03/02/18 1359 98 F (36.7 C)     Temp Source 03/02/18 1359 Oral     SpO2 03/02/18 1359 97 %     Weight --  Height --      Head Circumference --      Peak Flow --      Pain Score 03/02/18 1356 5     Pain Loc --      Pain Edu? --      Excl. in GC? --    No data found.  Updated Vital Signs BP (!) 150/91 (BP Location: Right Arm)   Pulse 100   Temp 98 F (36.7 C) (Oral)   Resp 20   SpO2 97%   Visual Acuity Right Eye Distance:   Left Eye Distance:   Bilateral Distance:    Right Eye Near:   Left Eye Near:    Bilateral Near:     Physical Exam  Constitutional: He is oriented to person, place, and time. He appears well-developed and well-nourished.  HENT:  Head: Normocephalic and atraumatic.  Right Ear: External ear normal.  Left Ear: External ear normal.  Eyes: Pupils are equal, round, and reactive to light. Conjunctivae and EOM are normal.  Neck: Normal range of motion.    Cardiovascular: Normal rate and regular rhythm.  Pulmonary/Chest: Effort normal. He has wheezes. He exhibits tenderness.  Left lateral ribs with tenderness on palpation     Neurological: He is alert and oriented to person, place, and time.  Skin: Skin is warm and dry.     UC Treatments / Results  Labs (all labs ordered are listed, but only abnormal results are displayed) Labs Reviewed - No data to display  EKG None  Radiology No results found.  Procedures Procedures (including critical care time)  Medications Ordered in UC Medications - No data to display  Initial Impression / Assessment and Plan / UC Course  I have reviewed the triage vital signs and the nursing notes.  Pertinent labs & imaging results that were available during my care of the patient were reviewed by me and considered in my medical decision making (see chart for details).     Pain reproducible on palpation to left ribs. No increased work of breathing, shortness of breath. No chest pain or palpitations. No injury. Deferred imaging at this time. Naproxen twice a day, ice application. Return precautions provided. Patient verbalized understanding and agreeable to plan.  Ambulatory out of clinic without difficulty.    Final Clinical Impressions(s) / UC Diagnoses   Final diagnoses:  Costochondritis     Discharge Instructions     Naproxen twice a day, take with food.  Ice pack may also be helpful to apply to the area. Light and regular activity.  Inhaler as needed for cough or wheezing. Continue to decrease to quit smoking.  If symptoms worsen or do not improve in the next week to return to be seen or to follow up with your PCP.      ED Prescriptions    Medication Sig Dispense Auth. Provider   naproxen (NAPROSYN) 500 MG tablet Take 1 tablet (500 mg total) by mouth 2 (two) times daily. 30 tablet Georgetta Haber, NP     Controlled Substance Prescriptions [place] Controlled Substance Registry  consulted? Not Applicable   Georgetta Haber, NP 03/02/18 1420

## 2018-03-02 NOTE — Discharge Instructions (Signed)
Naproxen twice a day, take with food.  Ice pack may also be helpful to apply to the area. Light and regular activity.  Inhaler as needed for cough or wheezing. Continue to decrease to quit smoking.  If symptoms worsen or do not improve in the next week to return to be seen or to follow up with your PCP.

## 2018-03-02 NOTE — ED Triage Notes (Signed)
Pt presents with left lung pain from a past lung condition believes it to be the same thing. Pt also complains of exhaustion and is unsure why.

## 2018-06-26 IMAGING — CR DG CHEST 2V
2 series · 2 of 2 positions shown · non-contrast
Comparison: Chest radiograph December 17, 2012

CLINICAL DATA: LEFT shoulder pain and shortness of breath beginning
yesterday. History of asthma.

EXAM:
CHEST  2 VIEW

[chest pa]
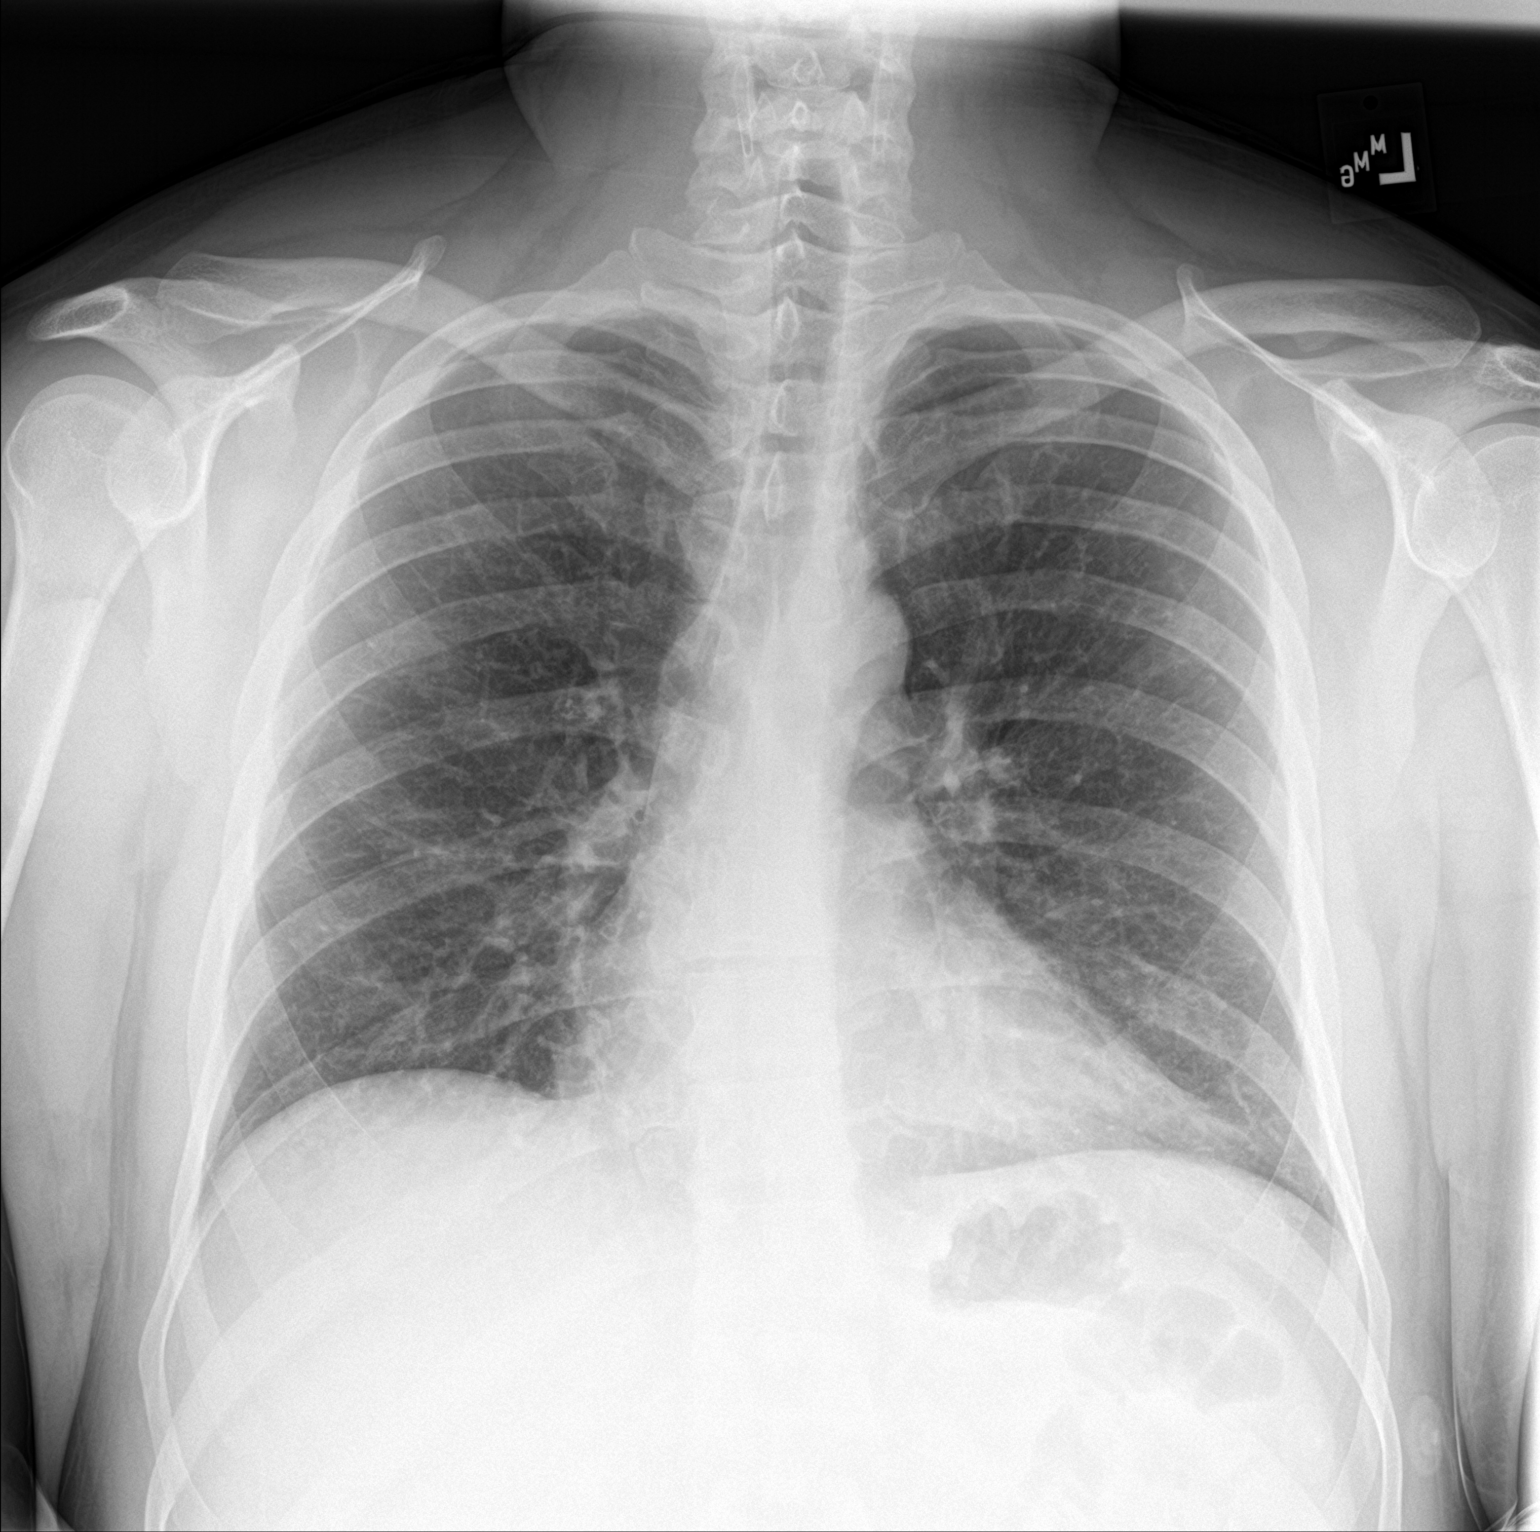

[chest lat]
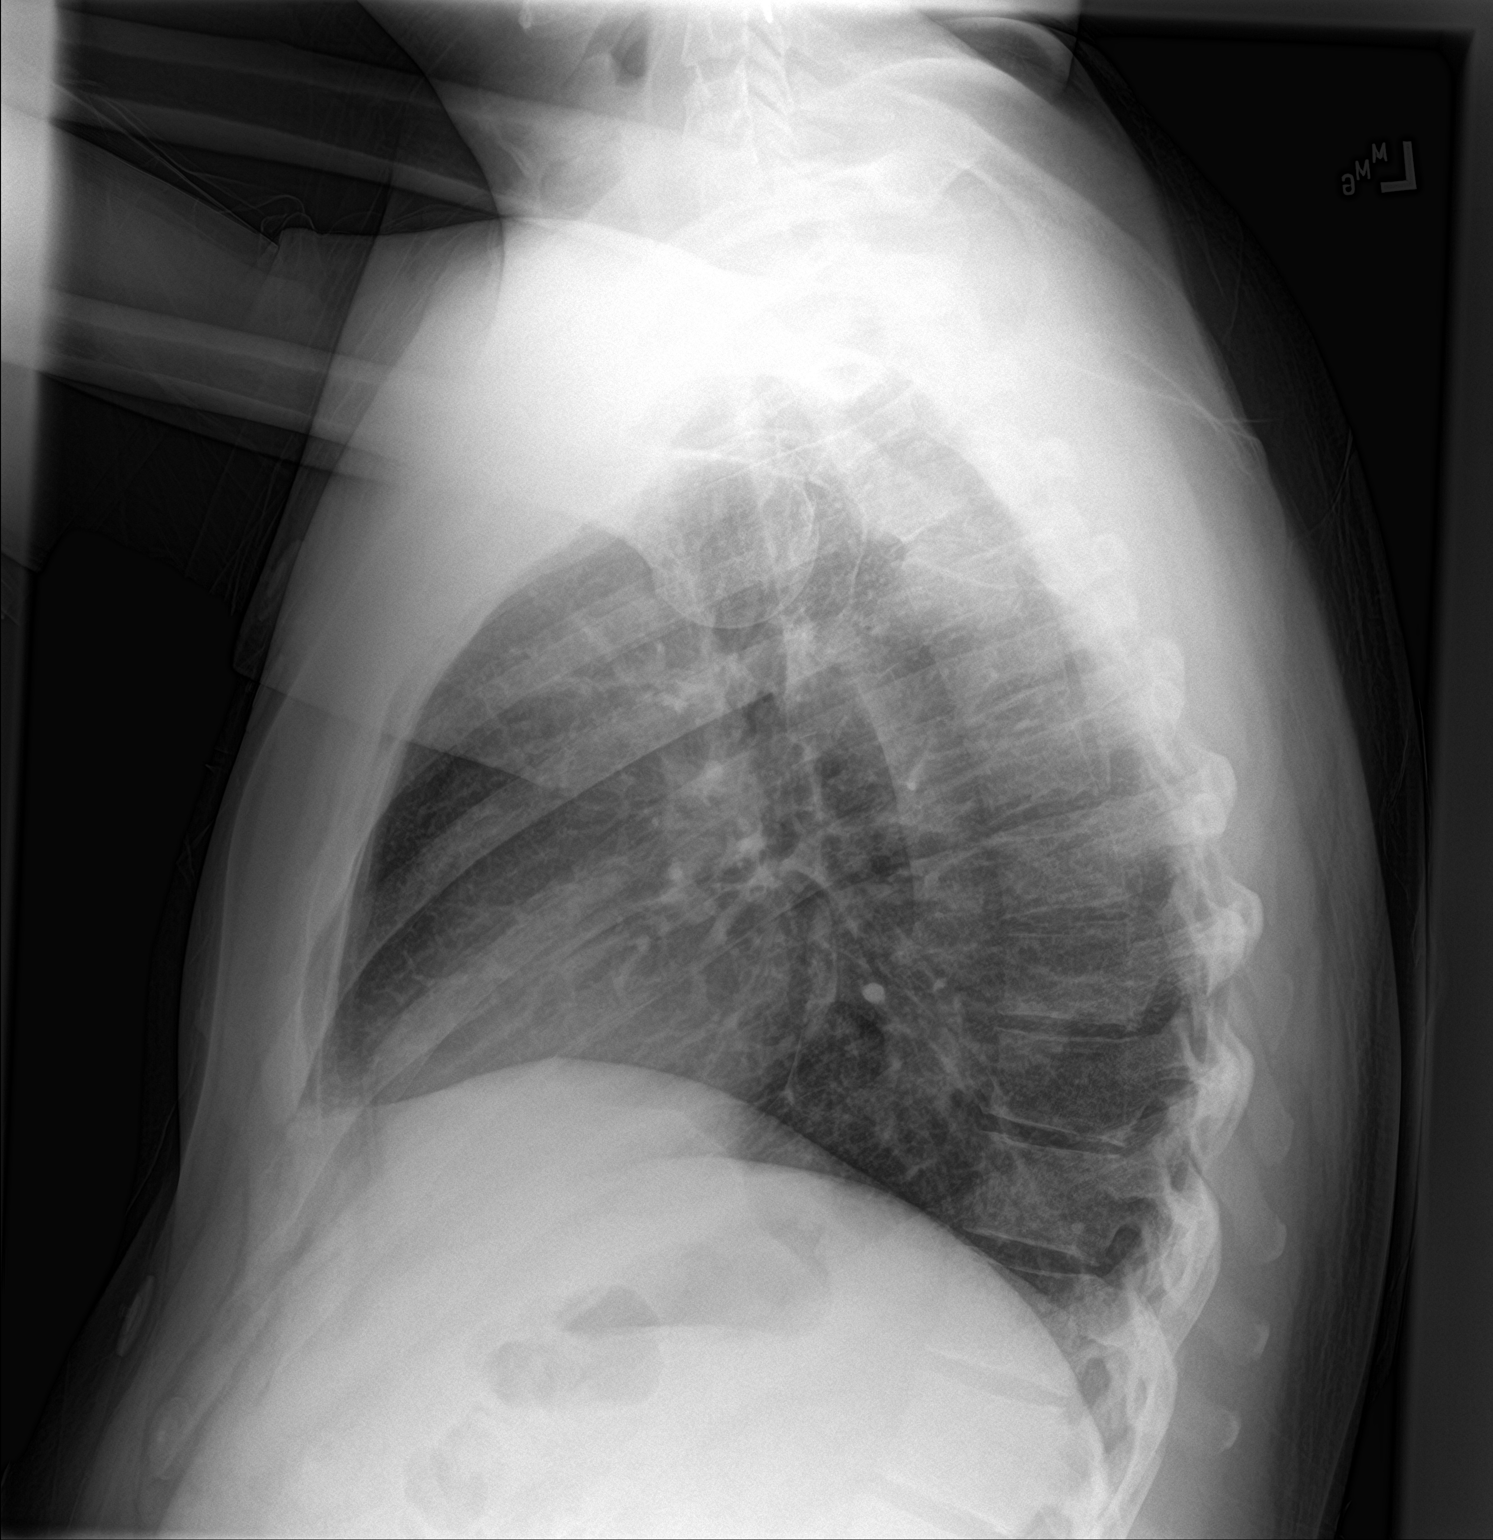

[2 of 2 positions shown; findings below may reference images not displayed]

FINDINGS: Cardiomediastinal silhouette is normal. No pleural effusions or
focal consolidations. Mild bronchitic changes strandy densities LEFT
lung base. Trachea projects midline and there is no pneumothorax.
Soft tissue planes and included osseous structures are
non-suspicious.
IMPRESSION: Mild bronchitic changes and LEFT lung base atelectasis.

## 2018-08-19 ENCOUNTER — Ambulatory Visit (HOSPITAL_COMMUNITY)
Admission: EM | Admit: 2018-08-19 | Discharge: 2018-08-19 | Disposition: A | Discharge status: Home / Self Care | Payer: Medicaid Other | Attending: Family Medicine | Admitting: Family Medicine

## 2018-08-19 ENCOUNTER — Encounter (HOSPITAL_COMMUNITY): Payer: Self-pay | Admitting: Family Medicine

## 2018-08-19 DIAGNOSIS — R112 Nausea with vomiting, unspecified: Secondary | ICD-10-CM | POA: Insufficient documentation

## 2018-08-19 DIAGNOSIS — R197 Diarrhea, unspecified: Secondary | ICD-10-CM

## 2018-08-19 MED ORDER — OMEPRAZOLE 20 MG PO CPDR: 20.0000 mg | DELAYED_RELEASE_CAPSULE | Freq: Every day | ORAL | 1 refills | Status: DC

## 2018-08-19 MED ORDER — ONDANSETRON 8 MG PO TBDP: 8.0000 mg | ORAL_TABLET | Freq: Three times a day (TID) | ORAL | 0 refills | Status: DC | PRN

## 2018-08-19 NOTE — ED Notes (Signed)
Seen by provider prior to triage 

## 2018-08-19 NOTE — Discharge Instructions (Addendum)
Reduce caffeine drinks (energy drinks) Avoid milk containing products for 2 days  Follow up with your primary care doctor regarding the intestinal problem and blood pressure

## 2018-08-19 NOTE — ED Provider Notes (Signed)
[facility]    CSN: [id number] Arrival date & time: 08/19/18  [id number]     History   Chief Complaint No chief complaint on file.   HPI Wesley Clarke is a 32 y.o. male.   This is a 32 year old man, [facility] patient [facility], who presents with vomiting.  Patient developed nausea, vomiting, and diarrhea at about midnight.  He had a normal dinner last night.  His wife has flulike symptoms and his children have been sick with emesis and diarrhea recently.  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  He is a smoker.  He has a chronic cough.  Patient also has chronic postprandial diarrhea.  He drinks a lot of energy drinks and is trying to cut back.     Past Medical History:  Diagnosis Date  . ADHD (attention deficit hyperactivity disorder)   . Asthma   . Epileptic seizures (HCC)   . Impulse control disorder   . 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HCC)     Patient Active Problem List   Diagnosis Date Noted  . TOBACCO USER 05/20/2009  . RHINITIS, ALLERGIC, DUE TO POLLEN 06/15/2007  . 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 ATTENTION DEFICIT, W/HYPERACTIVITY 10/30/2006  . CONVULSIONS, SEIZURES, NOS 10/30/2006    History reviewed. No pertinent surgical history.     Home Medications    Prior to Admission medications   Medication Sig Start Date End Date Taking? Authorizing Provider  omeprazole (PRILOSEC) 20 MG capsule Take 1 capsule (20 mg total) by mouth daily. 08/19/18   Elvina SidleLauenstein, Zera Markwardt, MD  ondansetron (ZOFRAN-ODT) 8 MG disintegrating tablet Take 1 tablet (8 mg total) by mouth every 8 (eight) hours as needed for nausea. 08/19/18   Elvina SidleLauenstein, Keante Urizar, MD    Family History No family history on file.  Social History Social History   Tobacco Use  . Smoking status: Current Every Day Smoker    Types: Cigarettes  . Smokeless tobacco: Never Used  Substance Use Topics  . Alcohol use: No  . Drug use: Yes    Types:  Marijuana     Allergies   Patient has no known allergies.   Review of Systems Review of Systems   Physical Exam Triage Vital Signs ED Triage Vitals  Enc Vitals Group     BP      Pulse      Resp      Temp      Temp src      SpO2      Weight      Height      Head Circumference      Peak Flow      Pain Score      Pain Loc      Pain Edu?      Excl. in GC?    No data found.  Updated Vital Signs BP (!) 169/104 (BP Location: Right Arm)   Pulse 84   Temp 97.7 F (36.5 C) (Oral)   Resp 16   SpO2 98%    Physical Exam Vitals signs and nursing note reviewed.  Constitutional:      Appearance: Normal appearance.  HENT:     Head: Normocephalic.     Right Ear: External ear normal.     Left Ear: External ear normal.     Nose: Nose normal.     Mouth/Throat:     Mouth: Mucous membranes are moist.     Comments: Poor dental condition  Eyes:     Conjunctiva/sclera: Conjunctivae normal.  Cardiovascular:     Rate and Rhythm: Normal rate.     Heart sounds: Normal heart sounds.  Pulmonary:     Effort: Pulmonary effort is normal.     Breath sounds: Normal breath sounds.  Abdominal:     General: Bowel sounds are normal.     Palpations: There is no mass.     Tenderness: There is abdominal tenderness. There is no guarding or rebound.     Comments: Mild epigastric tenderness  Musculoskeletal: Normal range of motion.  Skin:    General: Skin is warm and dry.  Neurological:     General: No focal deficit present.     Mental Status: He is alert and oriented to person, place, and time.  Psychiatric:        Mood and Affect: Mood normal.      UC Treatments / Results  Labs (all labs ordered are listed, but only abnormal results are displayed) Labs Reviewed - No data to display  EKG None  Radiology No results found.  Procedures Procedures (including critical care time)  Medications Ordered in UC Medications - No data to display  Initial Impression / Assessment  and Plan / UC Course  I have reviewed the triage vital signs and the nursing notes.  Pertinent labs & imaging results that were available during my care of the patient were reviewed by me and considered in my medical decision making (see chart for details).    Final Clinical Impressions(s) / UC Diagnoses   Final diagnoses:  Nausea vomiting and diarrhea     Discharge Instructions     Reduce caffeine drinks (energy drinks) Avoid milk containing products for 2 days  Follow up with your primary care doctor regarding the intestinal problem and blood pressure      ED Prescriptions    Medication Sig Dispense Auth. Provider   ondansetron (ZOFRAN-ODT) 8 MG disintegrating tablet Take 1 tablet (8 mg total) by mouth every 8 (eight) hours as needed for nausea. 12 tablet Elvina Sidle, MD   omeprazole (PRILOSEC) 20 MG capsule Take 1 capsule (20 mg total) by mouth daily. 30 capsule Elvina Sidle, MD     Controlled Substance Prescriptions [place] Controlled Substance Registry consulted? Not Applicable   Elvina Sidle, MD 08/19/18 1105

## 2018-11-17 ENCOUNTER — Other Ambulatory Visit: Payer: Self-pay

## 2018-11-17 ENCOUNTER — Encounter (HOSPITAL_COMMUNITY): Payer: Self-pay

## 2018-11-17 ENCOUNTER — Ambulatory Visit (HOSPITAL_COMMUNITY)
Admission: EM | Admit: 2018-11-17 | Discharge: 2018-11-17 | Disposition: A | Discharge status: Home / Self Care | Payer: Medicaid Other | Attending: Family Medicine | Admitting: Family Medicine

## 2018-11-17 DIAGNOSIS — J069 Acute upper respiratory infection, unspecified: Secondary | ICD-10-CM | POA: Diagnosis not present

## 2018-11-17 DIAGNOSIS — B9789 Other viral agents as the cause of diseases classified elsewhere: Secondary | ICD-10-CM | POA: Diagnosis not present

## 2018-11-17 DIAGNOSIS — J4521 Mild intermittent asthma with (acute) exacerbation: Secondary | ICD-10-CM

## 2018-11-17 MED ORDER — BENZONATATE 200 MG PO CAPS: 200.0000 mg | ORAL_CAPSULE | Freq: Two times a day (BID) | ORAL | 0 refills | Status: DC | PRN

## 2018-11-17 MED ORDER — ALBUTEROL SULFATE (2.5 MG/3ML) 0.083% IN NEBU: 2.5000 mg | INHALATION_SOLUTION | Freq: Four times a day (QID) | RESPIRATORY_TRACT | 0 refills | Status: DC | PRN

## 2018-11-17 NOTE — ED Provider Notes (Signed)
[facility]    CSN: [id number] Arrival date & time: 11/17/18  0102     History   Chief Complaint Chief Complaint  Patient presents with  . Cough  . Emesis    HPI Wesley Clarke is a 33 y.o. male.   HPI  Patient presents with productive cough with green phlegm since Sunday. He reports a history of asthma and chronic bronchitis as well as a cough at baseline related to his chronic cigarette use. He reports that cough worsened on Sunday. He has a nebulizer machine at home that he uses prn but he has not used it since becoming ill on Sunday. He denies sob, chills, fever, body aches or ear pain. He endorses some throat discomfort from coughing. He reports waking up early this morning feeling nauseated. He had 3-4 episodes of vomiting and went back to sleep. He has not had any other episodes of vomiting since. He denies abdominal pain or diarrhea.   Past Medical History:  Diagnosis Date  . ADHD (attention deficit hyperactivity disorder)   . Asthma   . Epileptic seizures (HCC)   . Impulse control disorder   . 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HCC)     Patient Active Problem List   Diagnosis Date Noted  . TOBACCO USER 05/20/2009  . RHINITIS, ALLERGIC, DUE TO POLLEN 06/15/2007  . 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 ATTENTION DEFICIT, W/HYPERACTIVITY 10/30/2006  . CONVULSIONS, SEIZURES, NOS 10/30/2006    History reviewed. No pertinent surgical history.     Home Medications    Prior to Admission medications   Medication Sig Start Date End Date Taking? Authorizing Provider  albuterol (PROVENTIL) (2.5 MG/3ML) 0.083% nebulizer solution Take 3 mLs (2.5 mg total) by nebulization every 6 (six) hours as needed for wheezing or shortness of breath. 11/17/18   Eustace Moore, MD  benzonatate (TESSALON) 200 MG capsule Take 1 capsule (200 mg total) by mouth 2 (two) times daily as needed for cough. 11/17/18   Eustace Moore, MD    Family History History reviewed. No pertinent  family history.  Social History Social History   Tobacco Use  . Smoking status: Current Every Day Smoker    Types: Cigarettes  . Smokeless tobacco: Never Used  Substance Use Topics  . Alcohol use: No  . Drug use: Yes    Types: Marijuana     Allergies   Patient has no known allergies.   Review of Systems Review of Systems  Constitutional: Negative for chills and fever.  HENT: Positive for sore throat. Negative for ear pain, rhinorrhea, sinus pressure and sinus pain.   Eyes: Negative for pain and visual disturbance.  Respiratory: Positive for cough. Negative for shortness of breath.   Cardiovascular: Negative for chest pain and palpitations.  Gastrointestinal: Negative for abdominal pain, diarrhea, nausea and vomiting.  Genitourinary: Negative for dysuria and hematuria.  Musculoskeletal: Negative for arthralgias and back pain.  Skin: Negative for color change and rash.  Neurological: Negative for seizures and syncope.  All other systems reviewed and are negative.    Physical Exam Triage Vital Signs ED Triage Vitals  Enc Vitals Group     BP 11/17/18 0934 (!) 150/99     Pulse Rate 11/17/18 0934 92     Resp 11/17/18 0934 18     Temp 11/17/18 0934 97.7 F (36.5 C)     Temp Source 11/17/18 0934 Oral     SpO2 11/17/18 0934 99 %     Weight --  Height --      Head Circumference --      Peak Flow --      Pain Score 11/17/18 0936 0     Pain Loc --      Pain Edu? --      Excl. in GC? --    No data found.  Updated Vital Signs BP (!) 150/99 (BP Location: Right Arm)   Pulse 92   Temp 97.7 F (36.5 C) (Oral)   Resp 18   SpO2 99%   Physical Exam Constitutional:      General: He is not in acute distress.    Appearance: He is well-developed.  HENT:     Head: Normocephalic and atraumatic.     Mouth/Throat:     Mouth: Mucous membranes are moist.  Eyes:     Conjunctiva/sclera: Conjunctivae normal.     Pupils: Pupils are equal, round, and reactive to light.   Neck:     Musculoskeletal: Normal range of motion.  Cardiovascular:     Rate and Rhythm: Normal rate.  Pulmonary:     Effort: Pulmonary effort is normal. No respiratory distress.     Breath sounds: Wheezing present.  Abdominal:     General: There is no distension.     Palpations: Abdomen is soft.  Musculoskeletal: Normal range of motion.  Skin:    General: Skin is warm and dry.  Neurological:     General: No focal deficit present.     Mental Status: He is alert and oriented to person, place, and time.  Psychiatric:        Mood and Affect: Mood normal.      UC Treatments / Results  Labs (all labs ordered are listed, but only abnormal results are displayed) Labs Reviewed - No data to display  EKG None  Radiology No results found.  Procedures Procedures (including critical care time)  Medications Ordered in UC Medications - No data to display  Initial Impression / Assessment and Plan / UC Course  I have reviewed the triage vital signs and the nursing notes.  Pertinent labs & imaging results that were available during my care of the patient were reviewed by me and considered in my medical decision making (see chart for details).      Final Clinical Impressions(s) / UC Diagnoses   Final diagnoses:  Viral URI with cough  Intermittent asthma with acute exacerbation, unspecified asthma severity     Discharge Instructions     Drink plenty of fluids Use a humidifier if you have one Take Tessalon 2-3 times a day for cough Use also Mucinex DM to loosen the congestion and mucus Use your albuterol nebulizer for wheezing.  This may also help control the coughing Expect improvement over next few days     ED Prescriptions    Medication Sig Dispense Auth. Provider   benzonatate (TESSALON) 200 MG capsule Take 1 capsule (200 mg total) by mouth 2 (two) times daily as needed for cough. 20 capsule Eustace Moore, MD   albuterol (PROVENTIL) (2.5 MG/3ML) 0.083%  nebulizer solution Take 3 mLs (2.5 mg total) by nebulization every 6 (six) hours as needed for wheezing or shortness of breath. 75 mL Eustace Moore, MD     Controlled Substance Prescriptions [place] Controlled Substance Registry consulted? Not Applicable   Eustace Moore, MD 11/17/18 1520

## 2018-11-17 NOTE — ED Triage Notes (Signed)
Pt present cough with vomiting.  The coughing symptoms started 2 days ago and the vomiting started last night.  Pt has tried otc medication with no relief.

## 2018-11-17 NOTE — Discharge Instructions (Addendum)
Drink plenty of fluids Use a humidifier if you have one Take Tessalon 2-3 times a day for cough Use also Mucinex DM to loosen the congestion and mucus Use your albuterol nebulizer for wheezing.  This may also help control the coughing Expect improvement over next few days

## 2018-12-24 ENCOUNTER — Other Ambulatory Visit: Payer: Self-pay

## 2018-12-24 ENCOUNTER — Ambulatory Visit (HOSPITAL_COMMUNITY)
Admission: EM | Admit: 2018-12-24 | Discharge: 2018-12-24 | Disposition: A | Discharge status: Home / Self Care | Payer: Medicaid Other | Attending: Internal Medicine | Admitting: Internal Medicine

## 2018-12-24 ENCOUNTER — Encounter (HOSPITAL_COMMUNITY): Payer: Self-pay

## 2018-12-24 DIAGNOSIS — Z7189 Other specified counseling: Secondary | ICD-10-CM

## 2018-12-24 DIAGNOSIS — J4520 Mild intermittent asthma, uncomplicated: Secondary | ICD-10-CM | POA: Diagnosis not present

## 2018-12-24 DIAGNOSIS — R52 Pain, unspecified: Secondary | ICD-10-CM

## 2018-12-24 DIAGNOSIS — Z720 Tobacco use: Secondary | ICD-10-CM | POA: Diagnosis not present

## 2018-12-24 DIAGNOSIS — R059 Cough, unspecified: Secondary | ICD-10-CM

## 2018-12-24 DIAGNOSIS — R05 Cough: Secondary | ICD-10-CM

## 2018-12-24 NOTE — ED Provider Notes (Signed)
[facility]    CSN: [id number] Arrival date & time: 12/24/18  [id number]     History   Chief Complaint Chief Complaint  Patient presents with  . Cough    HPI Wesley Clarke is a 33 y.o. male with a history of asthma, chronic tobacco use comes to urgent care with complaints of cough this morning.  Cough was nonproductive.  He was associated with generalized body aches.  Patient denies any fever or chills.  No nausea or vomiting.  Patient works [employer].  He appears to have a chronic cough and is currently slightly worse.  No dizziness, near syncope or syncope.  Patient denies any fever or chills.  Patient's daughter was recently  treated for what sounds like mesenteric adenitis.  No exposure to COVID-19 persons.  No sick contacts at home.   Past Medical History:  Diagnosis Date  . ADHD (attention deficit hyperactivity disorder)   . Asthma   . Epileptic seizures (HCC)   . Impulse control disorder   . 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HCC)     Patient Active Problem List   Diagnosis Date Noted  . TOBACCO USER 05/20/2009  . RHINITIS, ALLERGIC, DUE TO POLLEN 06/15/2007  . 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 ATTENTION DEFICIT, W/HYPERACTIVITY 10/30/2006  . CONVULSIONS, SEIZURES, NOS 10/30/2006    History reviewed. No pertinent surgical history.     Home Medications    Prior to Admission medications   Medication Sig Start Date End Date Taking? Authorizing Provider  albuterol (PROVENTIL) (2.5 MG/3ML) 0.083% nebulizer solution Take 3 mLs (2.5 mg total) by nebulization every 6 (six) hours as needed for wheezing or shortness of breath. 11/17/18   Eustace MooreNelson, Yvonne Sue, MD  benzonatate (TESSALON) 200 MG capsule Take 1 capsule (200 mg total) by mouth 2 (two) times daily as needed for cough. 11/17/18   Eustace MooreNelson, Yvonne Sue, MD    Family History History reviewed. No pertinent family history.  Social History Social History   Tobacco Use  . Smoking status: Current Every Day Smoker    Types: Cigarettes  . Smokeless tobacco: Never Used  Substance Use Topics  . Alcohol use: No  . Drug use: Yes    Types: Marijuana     Allergies   Patient has no known allergies.   Review of Systems Review of Systems  Constitutional: Negative for activity change, appetite change, fatigue and fever.  HENT: Negative.   Eyes: Negative.   Respiratory: Positive for cough. Negative for chest tightness, shortness of breath and wheezing.   Cardiovascular: Negative.   Gastrointestinal: Negative.   Endocrine: Negative.   Genitourinary: Negative for dysuria, frequency and urgency.  Musculoskeletal: Negative.   Allergic/Immunologic: Negative.   Neurological: Negative for dizziness, syncope, light-headedness and numbness.     Physical Exam Triage Vital Signs ED Triage Vitals  Enc Vitals Group     BP 12/24/18 0904 (!) 158/101     Pulse Rate 12/24/18 0904 (!) 104     Resp 12/24/18 0904 20     Temp 12/24/18 0904 98.8 F (37.1 C)     Temp src --      SpO2 12/24/18 0904 97 %     Weight --      Height --      Head Circumference --      Peak Flow --      Pain Score 12/24/18 0906 6     Pain Loc --      Pain Edu? --  Excl. in GC? --    No data found.  Updated Vital Signs BP (!) 158/101   Pulse (!) 104   Temp 98.8 F (37.1 C)   Resp 20   SpO2 97%   Visual Acuity Right Eye Distance:   Left Eye Distance:   Bilateral Distance:    Right Eye Near:   Left Eye Near:    Bilateral Near:     Physical Exam Vitals signs and nursing note reviewed.  Constitutional:      General: He is not in acute distress.    Appearance: Normal appearance. He is not ill-appearing.  Neck:     Musculoskeletal: Normal range of motion and neck supple. No neck rigidity or muscular tenderness.  Cardiovascular:     Rate and Rhythm: Normal rate and regular rhythm.  Pulmonary:     Effort: Pulmonary effort is normal. No respiratory distress.     Breath sounds: No stridor. No wheezing or rhonchi.   Abdominal:     General: Abdomen is flat. Bowel sounds are normal.  Musculoskeletal: Normal range of motion.  Skin:    Capillary Refill: Capillary refill takes less than 2 seconds.  Neurological:     General: No focal deficit present.     Mental Status: He is alert and oriented to person, place, and time.      UC Treatments / Results  Labs (all labs ordered are listed, but only abnormal results are displayed) Labs Reviewed - No data to display  EKG None  Radiology No results found.  Procedures Procedures (including critical care time)  Medications Ordered in UC Medications - No data to display  Initial Impression / Assessment and Plan / UC Course  I have reviewed the triage vital signs and the nursing notes.  Pertinent labs & imaging results that were available during my care of the patient were reviewed by me and considered in my medical decision making (see chart for details).     1.  Cough with generalized body aches: Patient given a work excuse for 3-5 days COVID-19 education was given Patient will return to work based on [employer] return to work recommendations  2.  Chronic tobacco use: Cessation advised  3.  Mild intermittent asthma without exacerbation: Continue bronchodilator treatments as needed Final Clinical Impressions(s) / UC Diagnoses   Final diagnoses:  Cough  Educated About Covid-19 Virus Infection   Discharge Instructions   None    ED Prescriptions    None     Controlled Substance Prescriptions [place] Controlled Substance Registry consulted? No   Merrilee Jansky, MD 12/24/18 1023

## 2018-12-24 NOTE — ED Notes (Signed)
Patient verbalizes understanding of discharge instructions. Opportunity for questioning and answers were provided. Patient discharged from [facility] by RN.  

## 2018-12-24 NOTE — ED Triage Notes (Signed)
States that he awoken to severe body pain and cough, pain has improved  Cough is present, productive at times

## 2019-02-08 ENCOUNTER — Ambulatory Visit (HOSPITAL_COMMUNITY)
Admission: EM | Admit: 2019-02-08 | Discharge: 2019-02-08 | Disposition: A | Discharge status: Home / Self Care | Payer: Medicaid Other | Attending: Family Medicine | Admitting: Family Medicine

## 2019-02-08 ENCOUNTER — Ambulatory Visit (INDEPENDENT_AMBULATORY_CARE_PROVIDER_SITE_OTHER): Payer: Medicaid Other

## 2019-02-08 ENCOUNTER — Other Ambulatory Visit: Payer: Self-pay

## 2019-02-08 ENCOUNTER — Encounter (HOSPITAL_COMMUNITY): Payer: Self-pay | Admitting: Emergency Medicine

## 2019-02-08 DIAGNOSIS — M546 Pain in thoracic spine: Secondary | ICD-10-CM

## 2019-02-08 DIAGNOSIS — W19XXXA Unspecified fall, initial encounter: Secondary | ICD-10-CM | POA: Diagnosis not present

## 2019-02-08 MED ORDER — IBUPROFEN 800 MG PO TABS: 800.0000 mg | ORAL_TABLET | Freq: Three times a day (TID) | ORAL | 0 refills | Status: DC

## 2019-02-08 MED ORDER — CYCLOBENZAPRINE HCL 5 MG PO TABS: 5.0000 mg | ORAL_TABLET | Freq: Two times a day (BID) | ORAL | 0 refills | Status: DC | PRN

## 2019-02-08 NOTE — Discharge Instructions (Signed)
Xray normal, no fracture of ribs/shoulder  Use anti-inflammatories for pain/swelling. You may take up to 800 mg Ibuprofen every 8 hours with food. You may supplement Ibuprofen with Tylenol [phone] mg every 8 hours.   You may use flexeril as needed to help with pain. This is a muscle relaxer and causes sedation- please use only at bedtime or when you will be home and not have to drive/work  Gentle stretching of shoulder/back  Follow up if not resolving

## 2019-02-08 NOTE — ED Triage Notes (Signed)
Pt states he slipped on his sons toy car and fell down about 14 steps, c/o R shoulder pain and R rib pain. Denies hitting head or LOC. Happened this morning.

## 2019-02-08 NOTE — ED Notes (Signed)
Bed: UC07 Expected date:  Expected time:  Means of arrival:  Comments: Use for well patients

## 2019-02-08 NOTE — ED Provider Notes (Signed)
[place] [facility]    CSN: [id number] Arrival date & time: 02/08/19  0907     History   Chief Complaint Chief Complaint  Patient presents with  . Fall  . Shoulder Pain    HPI Wesley Clarke is a 33 y.o. male history of epilepsy, asthma, tobacco use, presenting today for evaluation of right-sided shoulder and rib pain.  Patient states that he was walking in his house, slipped on his kids toy cars and landed on his back onto stairs.  This happened this morning.  Since he has had pain with breathing as well as mainly in his back.  Denies previous injury to back or shoulder.  Has pain with lifting shoulder, but denies difficulty moving the shoulder.  Denies numbness or tingling.  Denies neck pain.  Denies hitting head or changes in vision.  Patient works at [employer] and does heavy lifting.   HPI  Past Medical History:  Diagnosis Date  . ADHD (attention deficit hyperactivity disorder)   . Asthma   . Epileptic seizures ([place])   . Impulse control disorder   . 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[place])     Patient Active Problem List   Diagnosis Date Noted  . TOBACCO USER 05/20/2009  . RHINITIS, ALLERGIC, DUE TO POLLEN 06/15/2007  . 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 ATTENTION DEFICIT, W/HYPERACTIVITY 10/30/2006  . CONVULSIONS, SEIZURES, NOS 10/30/2006    History reviewed. No pertinent surgical history.     Home Medications    Prior to Admission medications   Medication Sig Start Date End Date Taking? Authorizing Provider  albuterol (PROVENTIL) (2.5 MG/3ML) 0.083% nebulizer solution Take 3 mLs (2.5 mg total) by nebulization every 6 (six) hours as needed for wheezing or shortness of breath. 11/17/18   Raylene Everts, MD  benzonatate (TESSALON) 200 MG capsule Take 1 capsule (200 mg total) by mouth 2 (two) times daily as needed for cough. 11/17/18   Raylene Everts, MD  cyclobenzaprine (FLEXERIL) 5 MG tablet Take 1-2 tablets (5-10 mg total) by mouth 2 (two) times daily as  needed for muscle spasms. 02/08/19   Henriette Hesser C, PA-C  ibuprofen (ADVIL) 800 MG tablet Take 1 tablet (800 mg total) by mouth 3 (three) times daily. 02/08/19   Aldo Sondgeroth, Elesa Hacker, PA-C    Family History No family history on file.  Social History Social History   Tobacco Use  . Smoking status: Current Every Day Smoker    Types: Cigarettes  . Smokeless tobacco: Never Used  Substance Use Topics  . Alcohol use: No  . Drug use: Yes    Types: Marijuana     Allergies   Patient has no known allergies.   Review of Systems Review of Systems  Constitutional: Negative for fatigue and fever.  Eyes: Negative for redness, itching and visual disturbance.  Respiratory: Negative for shortness of breath.   Cardiovascular: Negative for chest pain and leg swelling.  Gastrointestinal: Negative for nausea and vomiting.  Musculoskeletal: Positive for back pain and myalgias. Negative for arthralgias.  Skin: Negative for color change, rash and wound.  Neurological: Negative for dizziness, syncope, weakness, light-headedness and headaches.     Physical Exam Triage Vital Signs ED Triage Vitals [02/08/19 0927]  Enc Vitals Group     BP (!) 157/104     Pulse Rate 91     Resp 16     Temp 98.2 F (36.8 C)     Temp Source Oral     SpO2 98 %  Weight      Height      Head Circumference      Peak Flow      Pain Score      Pain Loc      Pain Edu?      Excl. in GC?    No data found.  Updated Vital Signs BP (!) 157/104 (BP Location: Right Arm)   Pulse 91   Temp 98.2 F (36.8 C) (Oral)   Resp 16   SpO2 98%   Visual Acuity Right Eye Distance:   Left Eye Distance:   Bilateral Distance:    Right Eye Near:   Left Eye Near:    Bilateral Near:     Physical Exam Vitals signs and nursing note reviewed.  Constitutional:      Appearance: He is well-developed.     Comments: No acute distress  HENT:     Head: Normocephalic and atraumatic.     Nose: Nose normal.  Eyes:      Conjunctiva/sclera: Conjunctivae normal.  Neck:     Musculoskeletal: Neck supple.  Cardiovascular:     Rate and Rhythm: Normal rate.  Pulmonary:     Effort: Pulmonary effort is normal. No respiratory distress.     Comments: Breathing comfortably at rest, CTABL, no wheezing, rales or other adventitious sounds auscultated Abdominal:     General: There is no distension.  Musculoskeletal: Normal range of motion.     Comments: Nontender palpation of cervical, thoracic and lumbar spine midline.  Increased tenderness throughout right periscapular area  Nontender to palpation along clavicle and AC joint, tenderness over scapular spine and periscapular musculature. Full active range of motion of shoulder Strength 5/5 and equal bilaterally  Skin:    General: Skin is warm and dry.  Neurological:     Mental Status: He is alert and oriented to person, place, and time.      UC Treatments / Results  Labs (all labs ordered are listed, but only abnormal results are displayed) Labs Reviewed - No data to display  EKG None  Radiology Dg Ribs Unilateral W/chest Right  Result Date: 02/08/2019 CLINICAL DATA:  Pain following fall EXAM: RIGHT RIBS AND CHEST - 3+ VIEW COMPARISON:  Chest radiograph February 24, 2017. FINDINGS: Frontal chest as well as oblique and cone-down rib images obtained. There is slight atelectasis in the left base. Lungs elsewhere are clear. Heart size and pulmonary vascularity are normal. No adenopathy. There is no demonstrable pneumothorax or pleural effusion. There is no evident rib fracture. IMPRESSION: No evident rib fracture. No pneumothorax. Left base atelectasis. Lungs elsewhere clear. Electronically Signed   By: Bretta BangWilliam  Woodruff III M.D.   On: 02/08/2019 10:20    Procedures Procedures (including critical care time)  Medications Ordered in UC Medications - No data to display  Initial Impression / Assessment and Plan / UC Course  I have reviewed the triage vital signs and  the nursing notes.  Pertinent labs & imaging results that were available during my care of the patient were reviewed by me and considered in my medical decision making (see chart for details).     X-ray negative for rib fracture or bony abnormality of shoulder.  Most most likely muscular strain versus contusion.  Will recommend anti-inflammatories and muscle relaxers to help with discomfort.  Would expect gradual resolution over the next 1 to 2 weeks.  Gentle stretching.  Continue to monitor, follow-up if not resolving.Discussed strict return precautions. Patient verbalized understanding and is agreeable  with plan.  Final Clinical Impressions(s) / UC Diagnoses   Final diagnoses:  Acute right-sided thoracic back pain  Fall, initial encounter     Discharge Instructions     Xray normal, no fracture of ribs/shoulder  Use anti-inflammatories for pain/swelling. You may take up to 800 mg Ibuprofen every 8 hours with food. You may supplement Ibuprofen with Tylenol [phone] mg every 8 hours.   You may use flexeril as needed to help with pain. This is a muscle relaxer and causes sedation- please use only at bedtime or when you will be home and not have to drive/work  Gentle stretching of shoulder/back  Follow up if not resolving    ED Prescriptions    Medication Sig Dispense Auth. Provider   ibuprofen (ADVIL) 800 MG tablet Take 1 tablet (800 mg total) by mouth 3 (three) times daily. 21 tablet Meghan Warshawsky C, PA-C   cyclobenzaprine (FLEXERIL) 5 MG tablet Take 1-2 tablets (5-10 mg total) by mouth 2 (two) times daily as needed for muscle spasms. 24 tablet Alesandra Smart, T[place] CityHallie C, PA-C     Controlled Substance Prescriptions [place] Controlled Substance Registry consulted? Not Applicable   Lew DawesWieters, Yitta Gongaware C, [place] JerseyPA-C 02/09/19 [phone]

## 2019-04-26 ENCOUNTER — Other Ambulatory Visit: Payer: Self-pay

## 2019-04-26 ENCOUNTER — Ambulatory Visit (HOSPITAL_COMMUNITY)
Admission: EM | Admit: 2019-04-26 | Discharge: 2019-04-26 | Disposition: A | Discharge status: Home / Self Care | Payer: Medicaid Other | Attending: Internal Medicine | Admitting: Internal Medicine

## 2019-04-26 ENCOUNTER — Encounter (HOSPITAL_COMMUNITY): Payer: Self-pay | Admitting: Emergency Medicine

## 2019-04-26 DIAGNOSIS — R0602 Shortness of breath: Secondary | ICD-10-CM | POA: Diagnosis not present

## 2019-04-26 DIAGNOSIS — Z720 Tobacco use: Secondary | ICD-10-CM | POA: Diagnosis not present

## 2019-04-26 DIAGNOSIS — Z20828 Contact with and (suspected) exposure to other viral communicable diseases: Secondary | ICD-10-CM

## 2019-04-26 DIAGNOSIS — F17210 Nicotine dependence, cigarettes, uncomplicated: Secondary | ICD-10-CM | POA: Insufficient documentation

## 2019-04-26 DIAGNOSIS — Z20822 Contact with and (suspected) exposure to covid-19: Secondary | ICD-10-CM

## 2019-04-26 DIAGNOSIS — J45909 Unspecified asthma, uncomplicated: Secondary | ICD-10-CM | POA: Diagnosis not present

## 2019-04-26 DIAGNOSIS — R05 Cough: Secondary | ICD-10-CM | POA: Diagnosis present

## 2019-04-26 DIAGNOSIS — R059 Cough, unspecified: Secondary | ICD-10-CM

## 2019-04-26 MED ORDER — BENZONATATE 200 MG PO CAPS: 200.0000 mg | ORAL_CAPSULE | Freq: Two times a day (BID) | ORAL | 0 refills | Status: DC | PRN

## 2019-04-26 MED ORDER — ALBUTEROL SULFATE HFA 108 (90 BASE) MCG/ACT IN AERS: 1.0000 | INHALATION_SPRAY | Freq: Four times a day (QID) | RESPIRATORY_TRACT | 0 refills | Status: DC | PRN

## 2019-04-26 NOTE — Discharge Instructions (Signed)
Use the albuterol inhaler as prescribed.  You can take the Tessalon as needed for cough up to twice a day.    Your COVID test is pending.  You should self quarantine until your test result is back and is negative.    Go to the emergency department if you develop shortness of breath, high fever, severe diarrhea, or other concerning symptoms.

## 2019-04-26 NOTE — ED Provider Notes (Signed)
[facility]    CSN: [id number] Arrival date & time: 04/26/19  [id number]      History   Chief Complaint Chief Complaint  Patient presents with  . Shortness of Breath    HPI Wesley Clarke is a 33 y.o. male.   Patient presents with nonproductive cough and feeling short of breath since he woke up this morning.  No treatments attempted at home; he did not use his albuterol.  He denies fever, chills, sore throat, rash, abdominal pain, vomiting, diarrhea, or other symptoms.  His history is significant for tobacco use and asthma.  The history is provided by the patient.    Past Medical History:  Diagnosis Date  . ADHD (attention deficit hyperactivity disorder)   . Asthma   . Epileptic seizures (HCC)   . Impulse control disorder   . 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HCC)     Patient Active Problem List   Diagnosis Date Noted  . TOBACCO USER 05/20/2009  . RHINITIS, ALLERGIC, DUE TO POLLEN 06/15/2007  . 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 ATTENTION DEFICIT, W/HYPERACTIVITY 10/30/2006  . CONVULSIONS, SEIZURES, NOS 10/30/2006    History reviewed. No pertinent surgical history.     Home Medications    Prior to Admission medications   Medication Sig Start Date End Date Taking? Authorizing Provider  albuterol (PROVENTIL) (2.5 MG/3ML) 0.083% nebulizer solution Take 3 mLs (2.5 mg total) by nebulization every 6 (six) hours as needed for wheezing or shortness of breath. 11/17/18  Yes Eustace MooreNelson, Yvonne Sue, MD  albuterol (VENTOLIN HFA) 108 (90 Base) MCG/ACT inhaler Inhale 1-2 puffs into the lungs every 6 (six) hours as needed for wheezing or shortness of breath. 04/26/19   Mickie Bailate, Joangel Vanosdol H, NP  benzonatate (TESSALON) 200 MG capsule Take 1 capsule (200 mg total) by mouth 2 (two) times daily as needed for cough. 04/26/19   Mickie Bailate, Amarylis Rovito H, NP  cyclobenzaprine (FLEXERIL) 5 MG tablet Take 1-2 tablets (5-10 mg total) by mouth 2 (two) times daily as needed for muscle spasms. 02/08/19   Wieters, Hallie C,  PA-C  ibuprofen (ADVIL) 800 MG tablet Take 1 tablet (800 mg total) by mouth 3 (three) times daily. 02/08/19   Wieters, Junius CreamerHallie C, PA-C    Family History History reviewed. No pertinent family history.  Social History Social History   Tobacco Use  . Smoking status: Current Every Day Smoker    Types: Cigarettes  . Smokeless tobacco: Never Used  Substance Use Topics  . Alcohol use: No  . Drug use: Yes    Types: Marijuana     Allergies   Patient has no known allergies.   Review of Systems Review of Systems  Constitutional: Negative for chills and fever.  HENT: Negative for congestion, ear pain, rhinorrhea and sore throat.   Eyes: Negative for pain and visual disturbance.  Respiratory: Positive for cough and shortness of breath.   Cardiovascular: Negative for chest pain and palpitations.  Gastrointestinal: Negative for abdominal pain, diarrhea and vomiting.  Genitourinary: Negative for dysuria and hematuria.  Musculoskeletal: Negative for arthralgias and back pain.  Skin: Negative for color change and rash.  Neurological: Negative for seizures and syncope.  All other systems reviewed and are negative.    Physical Exam Triage Vital Signs ED Triage Vitals  Enc Vitals Group     BP      Pulse      Resp      Temp      Temp src      SpO2  Weight      Height      Head Circumference      Peak Flow      Pain Score      Pain Loc      Pain Edu?      Excl. in GC?    No data found.  Updated Vital Signs BP 140/88 (BP Location: Left Arm)   Pulse 93   Temp 98.2 F (36.8 C) (Oral)   Resp 18   SpO2 97%   Visual Acuity Right Eye Distance:   Left Eye Distance:   Bilateral Distance:    Right Eye Near:   Left Eye Near:    Bilateral Near:     Physical Exam Vitals signs and nursing note reviewed.  Constitutional:      General: He is not in acute distress.    Appearance: He is well-developed.  HENT:     Head: Normocephalic and atraumatic.     Right Ear:  Tympanic membrane normal.     Left Ear: Tympanic membrane normal.     Nose: Nose normal.     Mouth/Throat:     Mouth: Mucous membranes are moist.     Pharynx: Oropharynx is clear.  Eyes:     Conjunctiva/sclera: Conjunctivae normal.  Neck:     Musculoskeletal: Neck supple.  Cardiovascular:     Rate and Rhythm: Normal rate and regular rhythm.     Heart sounds: No murmur.  Pulmonary:     Effort: Pulmonary effort is normal. No respiratory distress.     Breath sounds: Normal breath sounds. No wheezing or rhonchi.  Abdominal:     General: Bowel sounds are normal.     Palpations: Abdomen is soft.     Tenderness: There is no abdominal tenderness. There is no guarding or rebound.  Skin:    General: Skin is warm and dry.     Findings: No rash.  Neurological:     Mental Status: He is alert.      UC Treatments / Results  Labs (all labs ordered are listed, but only abnormal results are displayed) Labs Reviewed  NOVEL CORONAVIRUS, NAA (HOSPITAL ORDER, SEND-OUT TO REF LAB)    EKG   Radiology No results found.  Procedures Procedures (including critical care time)  Medications Ordered in UC Medications - No data to display  Initial Impression / Assessment and Plan / UC Course  I have reviewed the triage vital signs and the nursing notes.  Pertinent labs & imaging results that were available during my care of the patient were reviewed by me and considered in my medical decision making (see chart for details).    Cough, suspect COVID.  Patient is well-appearing and his exam is unremarkable; he is in no apparent distress and his lungs are clear.  Treating with albuterol inhaler and Tessalon as needed.  COVID test performed here.  Instructed patient to self quarantine until his test result is back.  Instructed him to go to the emergency department if he develops shortness of breath, high fever, severe diarrhea, or other concerning symptoms.     Final Clinical Impressions(s) / UC  Diagnoses   Final diagnoses:  Cough  Suspected Covid-19 Virus Infection     Discharge Instructions     Use the albuterol inhaler as prescribed.  You can take the Tessalon as needed for cough up to twice a day.    Your COVID test is pending.  You should self quarantine until your test result is  back and is negative.    Go to the emergency department if you develop shortness of breath, high fever, severe diarrhea, or other concerning symptoms.        ED Prescriptions    Medication Sig Dispense Auth. Provider   benzonatate (TESSALON) 200 MG capsule Take 1 capsule (200 mg total) by mouth 2 (two) times daily as needed for cough. 20 capsule Sharion Balloon, NP   albuterol (VENTOLIN HFA) 108 (90 Base) MCG/ACT inhaler Inhale 1-2 puffs into the lungs every 6 (six) hours as needed for wheezing or shortness of breath. 18 g Sharion Balloon, NP     Controlled Substance Prescriptions [place] Controlled Substance Registry consulted? Not Applicable   Sharion Balloon, NP 04/26/19 1017

## 2019-04-26 NOTE — ED Triage Notes (Signed)
Patient reports chronic bronchitis.  Patient reports this usually happens with season changes or drastic weather changes.    Woke with sob, cough, phlegm-clear/yellow phlegm.  No fever

## 2019-04-28 LAB — NOVEL CORONAVIRUS, NAA (HOSP ORDER, SEND-OUT TO REF LAB; TAT 18-24 HRS): SARS-CoV-2, NAA: NOT DETECTED

## 2020-02-15 ENCOUNTER — Other Ambulatory Visit: Payer: Self-pay

## 2020-02-15 ENCOUNTER — Ambulatory Visit (HOSPITAL_COMMUNITY)
Admission: EM | Admit: 2020-02-15 | Discharge: 2020-02-15 | Disposition: A | Discharge status: Home / Self Care | Payer: Medicaid Other | Attending: Urgent Care | Admitting: Urgent Care

## 2020-02-15 ENCOUNTER — Encounter (HOSPITAL_COMMUNITY): Payer: Self-pay

## 2020-02-15 DIAGNOSIS — K920 Hematemesis: Secondary | ICD-10-CM

## 2020-02-15 DIAGNOSIS — K529 Noninfective gastroenteritis and colitis, unspecified: Secondary | ICD-10-CM

## 2020-02-15 DIAGNOSIS — I10 Essential (primary) hypertension: Secondary | ICD-10-CM

## 2020-02-15 DIAGNOSIS — R197 Diarrhea, unspecified: Secondary | ICD-10-CM

## 2020-02-15 MED ORDER — LOPERAMIDE HCL 2 MG PO CAPS: 2.0000 mg | ORAL_CAPSULE | Freq: Two times a day (BID) | ORAL | 0 refills | Status: DC | PRN

## 2020-02-15 MED ORDER — ONDANSETRON 8 MG PO TBDP: 8.0000 mg | ORAL_TABLET | Freq: Three times a day (TID) | ORAL | 0 refills | Status: DC | PRN

## 2020-02-15 MED ORDER — HYDROXYZINE HCL 25 MG PO TABS: 12.5000 mg | ORAL_TABLET | Freq: Every evening | ORAL | 0 refills | Status: DC | PRN

## 2020-02-15 NOTE — Discharge Instructions (Signed)
Make sure you push fluids drinking mostly water but mix it with Gatorade.  Try to eat light meals including soups, broths and soft foods, fruits.  You may use Zofran for your nausea and vomiting once every 8 hours.  Imodium can help with diarrhea but use this carefully limiting it to 1-2 times per day only if you are having a lot of diarrhea.  Please return to the clinic if symptoms worsen or you start having severe abdominal pain not helped by taking Tylenol or start having bloody stools or blood in the vomit. Go to the ER if your symptoms worsen including chest pain, shortness of breath or swelling of your belly.   Do not use any nonsteroidal anti-inflammatories (NSAIDs) like ibuprofen, Motrin, naproxen, Aleve, etc. which are all available over-the-counter.  Please just use Tylenol at a dose of 500mg -650mg  once every 6 hours as needed for your aches, pains, fevers.

## 2020-02-15 NOTE — ED Provider Notes (Addendum)
[place]   MRN: [id number] DOB: 27-Sep-1985  Subjective:   Wesley Clarke is a 34 y.o. male presenting for acute onset of nausea with vomiting, diarrhea.  Patient became concerned because he had blood in the toilet from his vomit earlier this morning.  He is also had upper abdominal pain, upset stomach.  Has not tried medication for relief.  Generally does not use NSAIDs.  Denies any recent hospitalizations, antibiotic use.  Denies fever, headache, confusion, chest pain, shortness of breath, abdominal distention, dysuria, hematuria, bloody stools.  Patient also complains of having general difficulty with his sleep.  Admits history of hypertension but is not taking any medications for this.  He has not had a PCP in the past year, does not want to do video visits and therefore preferred to wait to be seen in person.  No current facility-administered medications for this encounter.  Current Outpatient Medications:  .  albuterol (PROVENTIL) (2.5 MG/3ML) 0.083% nebulizer solution, Take 3 mLs (2.5 mg total) by nebulization every 6 (six) hours as needed for wheezing or shortness of breath., Disp: 75 mL, Rfl: 0 .  albuterol (VENTOLIN HFA) 108 (90 Base) MCG/ACT inhaler, Inhale 1-2 puffs into the lungs every 6 (six) hours as needed for wheezing or shortness of breath., Disp: 18 g, Rfl: 0 .  ibuprofen (ADVIL) 800 MG tablet, Take 1 tablet (800 mg total) by mouth 3 (three) times daily., Disp: 21 tablet, Rfl: 0   No Known Allergies  Past Medical History:  Diagnosis Date  . ADHD (attention deficit hyperactivity disorder)   . Asthma   . Epileptic seizures ([place])   . Impulse control disorder   . 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[place])      History reviewed. No pertinent surgical history.  History reviewed. No pertinent family history.  Social History   Tobacco Use  . Smoking status: Current Every Day Smoker    Types: Cigarettes  . Smokeless tobacco: Never Used  Substance Use Topics  . Alcohol use:  No  . Drug use: Yes    Types: Marijuana    ROS   Objective:   Vitals: BP (!) 169/100   Pulse 76   Temp 98 F (36.7 C)   Resp 16   SpO2 95%   BP is 148/103 on recheck by PA-Jaclene Bartelt.   BP Readings from Last 3 Encounters:  02/15/20 (!) 169/100  04/26/19 140/88  02/08/19 (!) 157/104   Physical Exam Constitutional:      General: He is not in acute distress.    Appearance: Normal appearance. He is well-developed. He is not ill-appearing, toxic-appearing or diaphoretic.  HENT:     Head: Normocephalic and atraumatic.     Right Ear: External ear normal.     Left Ear: External ear normal.     Nose: Nose normal.     Mouth/Throat:     Mouth: Mucous membranes are moist.     Pharynx: Oropharynx is clear.  Eyes:     General: No scleral icterus.    Extraocular Movements: Extraocular movements intact.     Pupils: Pupils are equal, round, and reactive to light.  Cardiovascular:     Rate and Rhythm: Normal rate and regular rhythm.     Heart sounds: Normal heart sounds. No murmur heard.  No friction rub. No gallop.   Pulmonary:     Effort: Pulmonary effort is normal. No respiratory distress.     Breath sounds: Normal breath sounds. No stridor. No wheezing, rhonchi or rales.  Abdominal:  General: Bowel sounds are normal. There is no distension.     Palpations: Abdomen is soft. There is no mass.     Tenderness: There is abdominal tenderness (upper, generalized). There is no right CVA tenderness, left CVA tenderness, guarding or rebound.  Skin:    General: Skin is warm and dry.  Neurological:     Mental Status: He is alert and oriented to person, place, and time.  Psychiatric:        Mood and Affect: Mood normal.        Behavior: Behavior normal.        Thought Content: Thought content normal.        Judgment: Judgment normal.     Assessment and Plan :   PDMP not reviewed this encounter.  1. Gastroenteritis   2. Nausea vomiting and diarrhea   3. Hematemesis with nausea    4. Essential hypertension     Will manage for suspected viral gastroenteritis with supportive care.  Recommended patient hydrate well, eat light meals and maintain electrolytes.  Will use Zofran and Imodium for nausea, vomiting and diarrhea.  Counseled on possibility of a tear, GI bleed and recommended strict ER precautions.  Provided patient with information to gastroenterology for consult should the symptoms persist but not meet ER criteria as discussed.  Also recommended he establish care with a new PCP, info provided to him.  Counseled patient on potential for adverse effects with medications prescribed/recommended today, ER and return-to-clinic precautions discussed, patient verbalized understanding.    Wallis Bamberg, PA-C 02/15/20 1159

## 2020-02-15 NOTE — ED Triage Notes (Signed)
Pt c/o waking up at 7AM "throwing up blood" x 3 times. "I don't know what it was, but the toilet water was red afterwards."   I'm having a hard time sleeping. Not taking anything OTC for sleep.

## 2020-03-07 ENCOUNTER — Encounter (HOSPITAL_COMMUNITY): Payer: Self-pay | Admitting: Emergency Medicine

## 2020-03-07 ENCOUNTER — Emergency Department (HOSPITAL_COMMUNITY)
Admission: EM | Admit: 2020-03-07 | Discharge: 2020-03-07 | Disposition: A | Discharge status: Home / Self Care | Payer: Medicaid Other | Attending: Emergency Medicine | Admitting: Emergency Medicine

## 2020-03-07 ENCOUNTER — Emergency Department (HOSPITAL_COMMUNITY): Payer: Medicaid Other

## 2020-03-07 DIAGNOSIS — F17210 Nicotine dependence, cigarettes, uncomplicated: Secondary | ICD-10-CM | POA: Insufficient documentation

## 2020-03-07 DIAGNOSIS — S0101XA Laceration without foreign body of scalp, initial encounter: Secondary | ICD-10-CM | POA: Diagnosis present

## 2020-03-07 DIAGNOSIS — S0990XA Unspecified injury of head, initial encounter: Secondary | ICD-10-CM

## 2020-03-07 DIAGNOSIS — Y929 Unspecified place or not applicable: Secondary | ICD-10-CM | POA: Diagnosis not present

## 2020-03-07 DIAGNOSIS — S060X1A Concussion with loss of consciousness of 30 minutes or less, initial encounter: Secondary | ICD-10-CM | POA: Diagnosis not present

## 2020-03-07 DIAGNOSIS — Y999 Unspecified external cause status: Secondary | ICD-10-CM | POA: Diagnosis not present

## 2020-03-07 DIAGNOSIS — Z79899 Other long term (current) drug therapy: Secondary | ICD-10-CM | POA: Insufficient documentation

## 2020-03-07 DIAGNOSIS — W11XXXA Fall on and from ladder, initial encounter: Secondary | ICD-10-CM | POA: Insufficient documentation

## 2020-03-07 DIAGNOSIS — Y939 Activity, unspecified: Secondary | ICD-10-CM | POA: Diagnosis not present

## 2020-03-07 DIAGNOSIS — W19XXXA Unspecified fall, initial encounter: Secondary | ICD-10-CM

## 2020-03-07 DIAGNOSIS — R030 Elevated blood-pressure reading, without diagnosis of hypertension: Secondary | ICD-10-CM

## 2020-03-07 HISTORY — DX: Unspecified injury of head, initial encounter: S09.90XA

## 2020-03-07 MED ORDER — MELOXICAM 7.5 MG PO TABS: 15.0000 mg | ORAL_TABLET | Freq: Every day | ORAL | 0 refills | Status: AC

## 2020-03-07 MED ORDER — HYDROCODONE-ACETAMINOPHEN 5-325 MG PO TABS
1.0000 | ORAL_TABLET | Freq: Once | ORAL | Status: AC
  Administered 2020-03-07: 12:00:00 1 via ORAL
  Filled 2020-03-07: qty 1

## 2020-03-07 NOTE — ED Notes (Signed)
Patient transported to CT 

## 2020-03-07 NOTE — ED Triage Notes (Signed)
Pt states he fell off of a ladder 8-10 ft, and hit his head on a metal barrel below. Unsure about LOC but states he remembers the accident. Does endorses hx of epilepsy. No blood thinners.

## 2020-03-07 NOTE — Discharge Instructions (Addendum)
You are seen today for fall and head injury.  Your head CT was normal.  Your laceration of your head was repaired.  You may have a slight concussion.  Please stay out of work until you have no symptoms or are cleared by neurology.  If you are having headache, sensitivity to light or signs of a concussion then we recommend brain rest.  If you have new or worsening symptoms we recommend reevaluation in the emergency department  You also need to follow-up with primary care to recheck your blood pressure as it has been elevated today.

## 2020-03-07 NOTE — ED Notes (Signed)
Can't locate pt in lobby

## 2020-03-07 NOTE — ED Provider Notes (Signed)
[facility] EMERGENCY DEPARTMENT Provider Note   CSN: [id number] Arrival date & time: 03/07/20  1023     History Chief Complaint  Patient presents with  . Fall    Wesley Clarke is a 34 y.o. male.  Patient is a 34 year old male with past medical history of ADHD, asthma, seizure disorder, impulse control disorder, manic depression presenting to the emergency department for evaluation after fall.  Patient reports that he was on top of the ladder when the ladder slid out from underneath him causing him to fall straight to the ground from about 7 to 10 feet.  Reports that his left hip hit the ground first and his head hit a metal bucket.  He does not think he passed out.  Bystanders said that he immediately got up on his feet began to walk around.  Patient reports that he does not initially remember getting up and walking around.  He currently now just has a slight headache but no other problems        Past Medical History:  Diagnosis Date  . ADHD (attention deficit hyperactivity disorder)   . Asthma   . Epileptic seizures ([facility])   . Impulse control disorder   . 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[facility])     Patient Active Problem List   Diagnosis Date Noted  . TOBACCO USER 05/20/2009  . RHINITIS, ALLERGIC, DUE TO POLLEN 06/15/2007  . 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 ATTENTION DEFICIT, W/HYPERACTIVITY 10/30/2006  . CONVULSIONS, SEIZURES, NOS 10/30/2006    History reviewed. No pertinent surgical history.     No family history on file.  Social History   Tobacco Use  . Smoking status: Current Every Day Smoker    Types: Cigarettes  . Smokeless tobacco: Never Used  Substance Use Topics  . Alcohol use: No  . Drug use: Yes    Types: Marijuana    Home Medications Prior to Admission medications   Medication Sig Start Date End Date Taking? Authorizing Provider  albuterol (PROVENTIL) (2.5 MG/3ML) 0.083% nebulizer solution Take 3 mLs (2.5 mg total) by nebulization  every 6 (six) hours as needed for wheezing or shortness of breath. 11/17/18  Yes Eustace Moore, MD  albuterol (VENTOLIN HFA) 108 (90 Base) MCG/ACT inhaler Inhale 1-2 puffs into the lungs every 6 (six) hours as needed for wheezing or shortness of breath. 04/26/19  Yes Mickie Bail, NP  hydrOXYzine (ATARAX/VISTARIL) 25 MG tablet Take 0.5-1 tablets (12.5-25 mg total) by mouth at bedtime as needed (insomnia). 02/15/20  Yes Wallis Bamberg, PA-C  ibuprofen (ADVIL) 800 MG tablet Take 1 tablet (800 mg total) by mouth 3 (three) times daily. Patient not taking: Reported on 03/07/2020 02/08/19   Wieters, Fran Lowes C, PA-C  loperamide (IMODIUM) 2 MG capsule Take 1 capsule (2 mg total) by mouth 2 (two) times daily as needed for diarrhea or loose stools. Patient not taking: Reported on 03/07/2020 02/15/20   Wallis Bamberg, PA-C  ondansetron (ZOFRAN-ODT) 8 MG disintegrating tablet Take 1 tablet (8 mg total) by mouth every 8 (eight) hours as needed for nausea or vomiting. Patient not taking: Reported on 03/07/2020 02/15/20   Wallis Bamberg, PA-C    Allergies    Patient has no known allergies.  Review of Systems   Review of Systems  Constitutional: Negative for appetite change, chills and fever.  Eyes: Negative for photophobia, pain and visual disturbance.  Respiratory: Negative for cough and shortness of breath.   Gastrointestinal: Negative for nausea and vomiting.  Musculoskeletal: Negative  for arthralgias.  Skin: Negative for rash and wound.  Neurological: Positive for headaches. Negative for dizziness, seizures, speech difficulty, weakness, light-headedness and numbness.    Physical Exam Updated Vital Signs BP (!) 164/121   Pulse 82   Temp 98.7 F (37.1 C) (Oral)   Resp 16   SpO2 94%   Physical Exam Vitals and nursing note reviewed.  Constitutional:      General: He is not in acute distress.    Appearance: Normal appearance. He is not ill-appearing, toxic-appearing or diaphoretic.  HENT:     Head:  Normocephalic. No raccoon eyes, Battle's sign, abrasion or contusion.   Eyes:     Conjunctiva/sclera: Conjunctivae normal.  Pulmonary:     Effort: Pulmonary effort is normal.  Musculoskeletal:     Cervical back: Full passive range of motion without pain. No swelling, deformity, lacerations, spasms, torticollis, tenderness, bony tenderness or crepitus. No pain with movement, spinous process tenderness or muscular tenderness. Normal range of motion.     Lumbar back: Normal.     Right hip: Normal.     Left hip: Normal.  Skin:    General: Skin is dry.  Neurological:     General: No focal deficit present.     Mental Status: He is alert.  Psychiatric:        Mood and Affect: Mood normal.     ED Results / Procedures / Treatments   Labs (all labs ordered are listed, but only abnormal results are displayed) Labs Reviewed - No data to display  EKG None  Radiology CT Head Wo Contrast  Result Date: 03/07/2020 CLINICAL DATA:  Head trauma, headache. EXAM: CT HEAD WITHOUT CONTRAST TECHNIQUE: Contiguous axial images were obtained from the base of the skull through the vertex without intravenous contrast. COMPARISON:  No pertinent prior studies available for comparison. FINDINGS: Brain: Cerebral volume is normal. There is no acute intracranial hemorrhage. No demarcated cortical infarct. No extra-axial fluid collection. No evidence of intracranial mass. No midline shift. Vascular: No hyperdense vessel. Skull: Normal. Negative for fracture or focal lesion. Sinuses/Orbits: Visualized orbits show no acute finding. Paranasal sinus mucosal thickening. Most notably, there is moderate/severe right maxillary sinus mucosal thickening and moderate ethmoid sinus mucosal thickening. Small amount of frothy secretions within the left maxillary sinus. No significant mastoid effusion. IMPRESSION: No CT evidence of acute intracranial abnormality. Paranasal sinus disease as described. Correlate for acute sinusitis.  Electronically Signed   By: Jackey Loge DO   On: 03/07/2020 12:47    Procedures Procedures (including critical care time)  Medications Ordered in ED Medications  HYDROcodone-acetaminophen (NORCO/VICODIN) 5-325 MG per tablet 1 tablet (1 tablet Oral Given 03/07/20 1227)    ED Course  I have reviewed the triage vital signs and the nursing notes.  Pertinent labs & imaging results that were available during my care of the patient were reviewed by me and considered in my medical decision making (see chart for details).  Clinical Course as of Mar 07 1345  Tue Mar 07, 2020  1321 Patient presenting for evaluation after fall and hitting his head.  On exam he is alert and oriented x4.  Appears well.  Does have a small laceration to the posterior scalp.  Normal range of motion and no tenderness to the neck.  Also normal range of motion and no tenderness to the rest of his spine and lower extremities.  Discussed with patient treatment including tetanus, x-rays, CT scan.  Patient declined a tetanus shot.  Patient also  declined x-rays and only wanted a CT of his head.  He does have a history of concussions in the past.  Reports feeling slightly foggy and has some amnesia to some of the events after the fall but only for a couple of seconds.  Discussed with him possibility of a concussion.  His head CT is normal.  We will give patient a work note and have him have neurology follow-up.  His wound was repaired with Dermabond.  Advised on return precautions.  Of note, patient's blood pressure was elevated today.  I discussed this with the patient.  It appears in the past he has had elevation in his blood pressure as well but has not been addressed.  Advised to follow-up with primary medical doctor.   [KM]    Clinical Course User Index [KM] Jeral Pinch   MDM Rules/Calculators/A&P                          Based on review of vitals, medical screening exam, lab work and/or imaging, there does not appear  to be an acute, emergent etiology for the patient's symptoms. Counseled pt on good return precautions and encouraged both PCP and ED follow-up as needed.  Prior to discharge, I also discussed incidental imaging findings with patient in detail and advised appropriate, recommended follow-up in detail.  Clinical Impression: 1. Fall, initial encounter   2. Injury of head, initial encounter   3. Laceration of scalp, initial encounter   4. Concussion with loss of consciousness of 30 minutes or less, initial encounter   5. Elevated blood pressure reading     Disposition: Discharge  Prior to providing a prescription for a controlled substance, I independently reviewed the patient's recent prescription history on the [place] Controlled Substance Reporting System. The patient had no recent or regular prescriptions and was deemed appropriate for a brief, less than 3 day prescription of narcotic for acute analgesia.  This note was prepared with assistance of Conservation officer, historic buildings. Occasional wrong-word or sound-a-like substitutions may have occurred due to the inherent limitations of voice recognition software.  Final Clinical Impression(s) / ED Diagnoses Final diagnoses:  Fall, initial encounter  Injury of head, initial encounter  Laceration of scalp, initial encounter  Concussion with loss of consciousness of 30 minutes or less, initial encounter  Elevated blood pressure reading    Rx / DC Orders ED Discharge Orders    None       Jeral Pinch 03/07/20 1347    Tilden Fossa, MD 03/07/20 1624

## 2020-03-07 NOTE — ED Notes (Signed)
Patient given discharge instructions. Questions were answered. Patient verbalized understanding of discharge instructions and care at home.  

## 2020-03-14 ENCOUNTER — Other Ambulatory Visit: Payer: Self-pay

## 2020-03-14 ENCOUNTER — Ambulatory Visit: Payer: Medicaid Other | Admitting: Diagnostic Neuroimaging

## 2020-03-14 ENCOUNTER — Encounter: Payer: Self-pay | Admitting: Diagnostic Neuroimaging

## 2020-03-14 VITALS — BP 139/94 | HR 80 | Ht 71.0 in | Wt 208.4 lb

## 2020-03-14 DIAGNOSIS — G40009 Localization-related (focal) (partial) idiopathic epilepsy and epileptic syndromes with seizures of localized onset, not intractable, without status epilepticus: Secondary | ICD-10-CM

## 2020-03-14 DIAGNOSIS — F0781 Postconcussional syndrome: Secondary | ICD-10-CM | POA: Diagnosis not present

## 2020-03-14 MED ORDER — DIVALPROEX SODIUM 500 MG PO DR TAB: 500.0000 mg | DELAYED_RELEASE_TABLET | Freq: Every day | ORAL | 0 refills | Status: DC

## 2020-03-14 NOTE — Patient Instructions (Signed)
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

## 2020-03-14 NOTE — Progress Notes (Signed)
[facility]  PATIENT: Wesley Clarke DOB: 01-09-1986  REFERRING CLINICIAN: No ref. provider found ER HISTORY FROM: patient  REASON FOR VISIT: new consult    HISTORICAL  CHIEF COMPLAINT:  Chief Complaint  Patient presents with  . 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  03/07/2020 patient was at work on a 10 foot ladder when the ladder slipped and patient fell down.  He landed on his back and hit his head on a 55 gallon steel drum.  Patient was stunned but was able to stand up immediately.  He had a laceration on the back of his head.  He had a brief period of memory lapse but was able to walk to another building to get help.  His supervisor helped him out and then patient went to the emergency room for evaluation.  Patient was diagnosed with concussion.  No seizures.  Patient has history of seizures from age 89 to 58 years old was diagnosed with benign rolandic epilepsy.  Typically would have nocturnal seizure events once to twice a week.  Patient was on Dilantin Neurontin from age 89 to 66 years old.  No antiseizure medications currently.  Still has some abnormal involuntary movements during sleep a few times a month.   REVIEW OF SYSTEMS: Full 14 system review of systems performed and negative with exception of: As per HPI.  ALLERGIES: No Known Allergies  HOME MEDICATIONS: Outpatient Medications Prior to Visit  Medication Sig Dispense Refill  . albuterol (PROVENTIL) (2.5 MG/3ML) 0.083% nebulizer solution Take 3 mLs (2.5 mg total) by nebulization every 6 (six) hours as needed for wheezing or  shortness of breath. 75 mL 0  . albuterol (VENTOLIN HFA) 108 (90 Base) MCG/ACT inhaler Inhale 1-2 puffs into the lungs every 6 (six) hours as needed for wheezing or shortness of breath. 18 g 0  . ibuprofen (ADVIL) 800 MG tablet Take 1 tablet (800 mg total) by mouth 3 (three) times daily. 21 tablet 0  . hydrOXYzine (ATARAX/VISTARIL) 25 MG tablet Take 0.5-1 tablets (12.5-25 mg total) by mouth at bedtime as needed (insomnia). (Patient not taking: Reported on 03/14/2020) 30 tablet 0  . meloxicam (MOBIC) 7.5 MG tablet Take 2 tablets (15 mg total) by mouth daily for 7 days. (Patient not taking: Reported on 03/14/2020) 14 tablet 0  . loperamide (IMODIUM) 2 MG capsule Take 1 capsule (2 mg total) by mouth 2 (two) times daily as needed for diarrhea or loose stools. (Patient not taking: Reported on 03/07/2020) 10 capsule 0  . ondansetron (ZOFRAN-ODT) 8 MG disintegrating tablet Take 1 tablet (8 mg total) by mouth every 8 (eight) hours as needed for nausea or vomiting. (Patient not taking: Reported on 03/07/2020) 15 tablet 0   No facility-administered medications prior to visit.    PAST MEDICAL HISTORY: Past Medical History:  Diagnosis Date  . ADHD (attention deficit hyperactivity disorder)   . Asthma   . Epileptic seizures (HCC)   . Head injury 03/07/2020   fell off ladder  . Impulse control disorder   . 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HCC)  PAST SURGICAL HISTORY: No past surgical history on file.  FAMILY HISTORY: No family history on file.  SOCIAL HISTORY: Social History   Socioeconomic History  . Marital status: Single    Spouse name: Not on file  . Number of children: Not on file  . Years of education: Not on file  . Highest education level: 9th grade  Occupational History    Comment: construction  Tobacco Use  . Smoking status: Current Every Day Smoker    Packs/day: 1.00    Types: Cigarettes  . Smokeless tobacco: Never Used  Substance and Sexual Activity  . Alcohol use: No  . Drug use: Yes     Types: Marijuana    Comment: 04/01/20 "when my body is in pain, maybe 2 x q/ 6 months"  . Sexual activity: Not on file  Other Topics Concern  . Not on file  Social History Narrative   Caffeine- 4 Monsters a day, sodas- amt varies, water, Body Armour w/o caffeine   Social Determinants of Health   Financial Resource Strain:   . Difficulty of Paying Living Expenses:   Food Insecurity:   . Worried About Programme researcher, broadcasting/film/video in the Last Year:   . Barista in the Last Year:   Transportation Needs:   . Freight forwarder (Medical):   Marland Kitchen Lack of Transportation (Non-Medical):   Physical Activity:   . Days of Exercise per Week:   . Minutes of Exercise per Session:   Stress:   . Feeling of Stress :   Social Connections:   . Frequency of Communication with Friends and Family:   . Frequency of Social Gatherings with Friends and Family:   . Attends Religious Services:   . Active Member of Clubs or Organizations:   . Attends Banker Meetings:   Marland Kitchen Marital Status:   Intimate Partner Violence:   . Fear of Current or Ex-Partner:   . Emotionally Abused:   Marland Kitchen Physically Abused:   . Sexually Abused:      PHYSICAL EXAM   GENERAL EXAM/CONSTITUTIONAL: Vitals:  Vitals:   03/14/20 0923  BP: (!) 139/94  Pulse: 80  Weight: 208 lb 6.4 oz (94.5 kg)  Height: 5\' 11"  (1.803 m)     Body mass index is 29.07 kg/m. Wt Readings from Last 3 Encounters:  03/14/20 208 lb 6.4 oz (94.5 kg)     Patient is in no distress; well developed, nourished and groomed; neck is supple  CARDIOVASCULAR:  Examination of carotid arteries is normal; no carotid bruits  Regular rate and rhythm, no murmurs  Examination of peripheral vascular system by observation and palpation is normal  EYES:  Ophthalmoscopic exam of optic discs and posterior segments is normal; no papilledema or hemorrhages  No exam data present  MUSCULOSKELETAL:  Gait, strength, tone, movements noted in Neurologic  exam below  NEUROLOGIC: MENTAL STATUS:  No flowsheet data found.  awake, alert, oriented to person, place and time  recent and remote memory intact  normal attention and concentration  language fluent, comprehension intact, naming intact  fund of knowledge appropriate  CRANIAL NERVE:   2nd - no papilledema on fundoscopic exam  2nd, 3rd, 4th, 6th - pupils equal and reactive to light, visual fields full to confrontation, extraocular muscles intact, no nystagmus  5th - facial sensation symmetric  7th - facial strength symmetric  8th - hearing intact  9th - palate elevates symmetrically, uvula midline  11th - shoulder shrug symmetric  12th -  tongue protrusion midline  MOTOR:   normal bulk and tone, full strength in the BUE, BLE  SENSORY:   normal and symmetric to light touch, temperature, vibration  COORDINATION:   finger-nose-finger, fine finger movements normal  REFLEXES:   deep tendon reflexes present and symmetric  GAIT/STATION:   narrow based gait     DIAGNOSTIC DATA (LABS, IMAGING, TESTING) - I reviewed patient records, labs, notes, testing and imaging myself where available.  Lab Results  Component Value Date   WBC 12.0 (H) 02/23/2017   HGB 15.4 02/23/2017   HCT 46.6 02/23/2017   MCV 87.8 02/23/2017   PLT 179 02/23/2017      Component Value Date/Time   NA 139 02/23/2017 2350   K 3.6 02/23/2017 2350   CL 107 02/23/2017 2350   CO2 26 02/23/2017 2350   GLUCOSE 109 (H) 02/23/2017 2350   BUN 10 02/23/2017 2350   CREATININE 0.68 02/23/2017 2350   CALCIUM 9.8 02/23/2017 2350   PROT 6.8 01/03/2017 1011   ALBUMIN 4.0 01/03/2017 1011   AST 24 01/03/2017 1011   ALT 34 01/03/2017 1011   ALKPHOS 74 01/03/2017 1011   BILITOT 0.4 01/03/2017 1011   GFRNONAA >60 02/23/2017 2350   GFRAA >60 02/23/2017 2350   No results found for: CHOL, HDL, LDLCALC, LDLDIRECT, TRIG, CHOLHDL No results found for: SWFU9N No results found for: VITAMINB12 No  results found for: TSH   03/07/20 CT head [I reviewed images myself and agree with interpretation. -VRP]  - No CT evidence of acute intracranial abnormality. - Paranasal sinus disease as described. Correlate for acute sinusitis.    ASSESSMENT AND PLAN  34 y.o. year old male here with:  Dx:  1. Post concussion syndrome   2. Rolandic epilepsy (HCC)      PLAN:  POST-CONCUSSION SYNDROME (fell from ladder on 03/07/20) - start depakote 500mg  at bedtime x 4 weeks (to help with headaches and mood)  HISTORY OF ROLANDIC EPILEPSY - continues with possible ongoing noctural spells; repeat EEG  FATIGUE (could be related to depression; consider sleep study) - follow up with PCP  Orders Placed This Encounter  Procedures  . EEG adult   Meds ordered this encounter  Medications  . divalproex (DEPAKOTE) 500 MG DR tablet    Sig: Take 1 tablet (500 mg total) by mouth at bedtime.    Dispense:  30 tablet    Refill:  0   Return pending test results, for pending if symptoms worsen or fail to improve.    , MD 03/14/2020, 9:47 AM Certified in Neurology, Neurophysiology and Neuroimaging  [facility] [address], [address] [place], [place] [phone]

## 2020-03-28 ENCOUNTER — Ambulatory Visit: Payer: Medicaid Other | Attending: Internal Medicine | Admitting: Family

## 2020-03-28 ENCOUNTER — Other Ambulatory Visit: Payer: Self-pay

## 2020-03-28 ENCOUNTER — Encounter: Payer: Self-pay | Admitting: Family

## 2020-03-28 VITALS — BP 133/91 | HR 83 | Resp 16 | Ht 70.0 in | Wt 210.4 lb

## 2020-03-28 DIAGNOSIS — Z09 Encounter for follow-up examination after completed treatment for conditions other than malignant neoplasm: Secondary | ICD-10-CM

## 2020-03-28 DIAGNOSIS — R519 Headache, unspecified: Secondary | ICD-10-CM

## 2020-03-28 MED ORDER — EXCEDRIN EXTRA STRENGTH 250-250-65 MG PO TABS: 1.0000 | ORAL_TABLET | Freq: Four times a day (QID) | ORAL | 0 refills | Status: DC | PRN

## 2020-03-28 NOTE — Patient Instructions (Addendum)
Excedrin Extra Strength for headaches. Follow-up in 4 to 6 weeks for complete physical examination. Follow-up with Neurology as scheduled.  Migraine Headache A migraine headache is a very strong throbbing pain on one side or both sides of your head. This type of headache can also cause other symptoms. It can last from 4 hours to 3 days. Talk with your doctor about what things may bring on (trigger) this condition. What are the causes? The exact cause of this condition is not known. This condition may be triggered or caused by:  Drinking alcohol.  Smoking.  Taking medicines, such as: ? Medicine used to treat chest pain (nitroglycerin). ? Birth control pills. ? Estrogen. ? Some blood pressure medicines.  Eating or drinking certain products.  Doing physical activity. Other things that may trigger a migraine headache include:  Having a menstrual period.  Pregnancy.  Hunger.  Stress.  Not getting enough sleep or getting too much sleep.  Weather changes.  Tiredness (fatigue). What increases the risk?  Being 55-69 years old.  Being male.  Having a family history of migraine headaches.  Being Caucasian.  Having depression or anxiety.  Being very overweight. What are the signs or symptoms?  A throbbing pain. This pain may: ? Happen in any area of the head, such as on one side or both sides. ? Make it hard to do daily activities. ? Get worse with physical activity. ? Get worse around bright lights or loud noises.  Other symptoms may include: ? Feeling sick to your stomach (nauseous). ? Vomiting. ? Dizziness. ? Being sensitive to bright lights, loud noises, or smells.  Before you get a migraine headache, you may get warning signs (an aura). An aura may include: ? Seeing flashing lights or having blind spots. ? Seeing bright spots, halos, or zigzag lines. ? Having tunnel vision or blurred vision. ? Having numbness or a tingling feeling. ? Having trouble  talking. ? Having weak muscles.  Some people have symptoms after a migraine headache (postdromal phase), such as: ? Tiredness. ? Trouble thinking (concentrating). How is this treated?  Taking medicines that: ? Relieve pain. ? Relieve the feeling of being sick to your stomach. ? Prevent migraine headaches.  Treatment may also include: ? Having acupuncture. ? Avoiding foods that bring on migraine headaches. ? Learning ways to control your body functions (biofeedback). ? Therapy to help you know and deal with negative thoughts (cognitive behavioral therapy). Follow these instructions at home: Medicines  Take over-the-counter and prescription medicines only as told by your doctor.  Ask your doctor if the medicine prescribed to you: ? Requires you to avoid driving or using heavy machinery. ? Can cause trouble pooping (constipation). You may need to take these steps to prevent or treat trouble pooping:  Drink enough fluid to keep your pee (urine) pale yellow.  Take over-the-counter or prescription medicines.  Eat foods that are high in fiber. These include beans, whole grains, and fresh fruits and vegetables.  Limit foods that are high in fat and sugar. These include fried or sweet foods. Lifestyle  Do not drink alcohol.  Do not use any products that contain nicotine or tobacco, such as cigarettes, e-cigarettes, and chewing tobacco. If you need help quitting, ask your doctor.  Get at least 8 hours of sleep every night.  Limit and deal with stress. General instructions      Keep a journal to find out what may bring on your migraine headaches. For example, write down: ? What  you eat and drink. ? How much sleep you get. ? Any change in what you eat or drink. ? Any change in your medicines.  If you have a migraine headache: ? Avoid things that make your symptoms worse, such as bright lights. ? It may help to lie down in a dark, quiet room. ? Do not drive or use heavy  machinery. ? Ask your doctor what activities are safe for you.  Keep all follow-up visits as told by your doctor. This is important. Contact a doctor if:  You get a migraine headache that is different or worse than others you have had.  You have more than 15 headache days in one month. Get help right away if:  Your migraine headache gets very bad.  Your migraine headache lasts longer than 72 hours.  You have a fever.  You have a stiff neck.  You have trouble seeing.  Your muscles feel weak or like you cannot control them.  You start to lose your balance a lot.  You start to have trouble walking.  You pass out (faint).  You have a seizure. Summary  A migraine headache is a very strong throbbing pain on one side or both sides of your head. These headaches can also cause other symptoms.  This condition may be treated with medicines and changes to your lifestyle.  Keep a journal to find out what may bring on your migraine headaches.  Contact a doctor if you get a migraine headache that is different or worse than others you have had.  Contact your doctor if you have more than 15 headache days in a month. This information is not intended to replace advice given to you by your health care provider. Make sure you discuss any questions you have with your health care provider. Document Revised: 12/11/2018 Document Reviewed: 10/01/2018 Elsevier Patient Education  2020 [employer].

## 2020-03-28 NOTE — Progress Notes (Signed)
Patient ID: Wesley Clarke, male    DOB: 09/25/1985  MRN: [id number]  CC: Hospitalization Follow-up (ED)   Subjective: Wesley Clarke is a 34 y.o. male with history of allergic rhinitis due to pollen, tobacco user, attention deficit with hyperactivity, patello-femoral syndrome, epileptic seizures, convulsions seizures, and concussions who presents for hospitalization follow-up.  1. ER FOLLOW UP: Time since discharge: 21 days  Hospital/facility: [facility] Emergency Department  Diagnosis: fall, injury of head, laceration of scalp, concussion with loss of consciousness of 30 minutes or less, elevated blood pressure reading Procedures/tests: CT head without contrast Consultants: none New medications: none Discharge instructions: neurology referral and PCP follow-up Status: better  Today reports feeling better back to normal except for worsening headaches which began post-fall.   Reports he has followed-up with Neurology and given Depakote for management of epilepsy.   2. HEADACHE: Patient complains of headache. He does have a headache at this time. Reports he has always had headaches but they worsened post-fall from ladder.  Description of Headaches: Location of pain: right-sided unilateral, temporal  Radiation of pain?:none Character of pain:sharp  Severity of pain: 6  Accompanying symptoms: none  Prodromal sx?: none  Rapidity of onset: sudden Typical duration of individual headache: 1 hour medications last for 1 hour if he takes Ibuprofen 800 mg or Tylenol 1000 mg and then headache returns later  Are most headaches similar in presentation? no - reports was having headaches prior to fall but since then headaches are worse  Typical precipitants: none  Temporal Pattern of Headaches: Started having HAs 3 weeks ago they worsened after fall Worst time of day: nighttime Awaken from sleep?: yes  Seasonal pattern?: no  Degree of Functional Impairment: no  Current  Use of Meds to Treat HA: Abortive meds? acetaminophen, NSAIDs (Ibuprofen) Daily use? yes - sometimes Prophylactic meds? none  Additional Relevant History: History of head/neck trauma? yes - concussion History of head/neck surgery? no Substance use: tobacco: cigarettes, caffeine: sodas/coffee   Patient Active Problem List   Diagnosis Date Noted  . TOBACCO USER 05/20/2009  . RHINITIS, ALLERGIC, DUE TO POLLEN 06/15/2007  . 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 ATTENTION DEFICIT, W/HYPERACTIVITY 10/30/2006  . CONVULSIONS, SEIZURES, NOS 10/30/2006     Current Outpatient Medications on File Prior to Visit  Medication Sig Dispense Refill  . albuterol (PROVENTIL) (2.5 MG/3ML) 0.083% nebulizer solution Take 3 mLs (2.5 mg total) by nebulization every 6 (six) hours as needed for wheezing or shortness of breath. 75 mL 0  . albuterol (VENTOLIN HFA) 108 (90 Base) MCG/ACT inhaler Inhale 1-2 puffs into the lungs every 6 (six) hours as needed for wheezing or shortness of breath. 18 g 0  . divalproex (DEPAKOTE) 500 MG DR tablet Take 1 tablet (500 mg total) by mouth at bedtime. 30 tablet 0  . hydrOXYzine (ATARAX/VISTARIL) 25 MG tablet Take 0.5-1 tablets (12.5-25 mg total) by mouth at bedtime as needed (insomnia). (Patient not taking: Reported on 03/14/2020) 30 tablet 0  . ibuprofen (ADVIL) 800 MG tablet Take 1 tablet (800 mg total) by mouth 3 (three) times daily. 21 tablet 0   No current facility-administered medications on file prior to visit.    No Known Allergies  Social History   Socioeconomic History  . Marital status: Single    Spouse name: Not on file  . Number of children: Not on file  . Years of education: Not on file  . Highest education level: 9th grade  Occupational History  Comment: construction  Tobacco Use  . Smoking status: Current Every Day Smoker    Packs/day: 1.00    Types: Cigarettes  . Smokeless tobacco: Never Used  Substance and Sexual Activity  . Alcohol use: No   . Drug use: Yes    Types: Marijuana    Comment: 04/01/20 "when my body is in pain, maybe 2 x q/ 6 months"  . Sexual activity: Not on file  Other Topics Concern  . Not on file  Social History Narrative   Caffeine- 4 Monsters a day, sodas- amt varies, water, Body Armour w/o caffeine   Social Determinants of Health   Financial Resource Strain:   . Difficulty of Paying Living Expenses:   Food Insecurity:   . Worried About Programme researcher, broadcasting/film/video in the Last Year:   . Barista in the Last Year:   Transportation Needs:   . Freight forwarder (Medical):   Marland Kitchen Lack of Transportation (Non-Medical):   Physical Activity:   . Days of Exercise per Week:   . Minutes of Exercise per Session:   Stress:   . Feeling of Stress :   Social Connections:   . Frequency of Communication with Friends and Family:   . Frequency of Social Gatherings with Friends and Family:   . Attends Religious Services:   . Active Member of Clubs or Organizations:   . Attends Banker Meetings:   Marland Kitchen Marital Status:   Intimate Partner Violence:   . Fear of Current or Ex-Partner:   . Emotionally Abused:   Marland Kitchen Physically Abused:   . Sexually Abused:     No family history on file.  No past surgical history on file.  ROS: Review of Systems Negative except as stated above  PHYSICAL EXAM: BP (!) 133/91   Pulse 83   Resp 16   Ht 5\' 10"  (1.778 m)   Wt (!) 210 lb 6.4 oz (95.4 kg)   SpO2 96%   BMI 30.19 kg/m    Wt Readings from Last 3 Encounters:  03/28/20 (!) 210 lb 6.4 oz (95.4 kg)  03/14/20 208 lb 6.4 oz (94.5 kg)   Physical Exam General appearance - alert, well appearing, and in no distress and oriented to person, place, and time Mental status - alert, oriented to person, place, and time Ears - bilateral TM's and external ear canals normal Nose - normal and patent, no erythema, discharge or polyps and normal nontender sinuses Mouth - mucous membranes moist, pharynx normal without  lesions Neck - supple, no significant adenopathy Lymphatics - no palpable lymphadenopathy, no hepatosplenomegaly Chest - clear to auscultation, no wheezes, rales or rhonchi, symmetric air entry, no tachypnea, retractions or cyanosis Heart - normal rate, regular rhythm, normal S1, S2, no murmurs, rubs, clicks or gallops Neurological - alert, oriented, normal speech, no focal findings or movement disorder noted, neck supple without rigidity, cranial nerves II through XII intact, DTR's normal and symmetric, motor and sensory grossly normal bilaterally, normal muscle tone, no tremors, strength 5/5, Romberg sign negative, normal gait and station Extremities - peripheral pulses normal, no pedal edema, no clubbing or cyanosis  ASSESSMENT AND PLAN: 1. Hospital discharge follow-up: -Today patient is in stable condition. Reports he feels back to normal since hospital discharge on 03/07/2020. -Reports he followed-up with Neurology on 03/14/2020 and was given Depakote for management of epileptic seizures. Counseled patient to keep follow-up scheduled appointments with them.  -Blood pressure not at goal during today's visit. Patient asymptomatic  without chest pressure, chest pain, palpitations, and shortness of breath. Patient reports he did smoke a cigarette prior to this visit. Patient should monitor home blood pressures, write them down, and bring those results with him to next visit in 4 to 6 weeks. -Patient reports he has not had a primary provider for at least a few years. Counseled patient to return in 4 to 6 weeks to establish care and for complete physical examination and labs. Patient verbalized understanding.  2. Nonintractable headache, unspecified chronicity pattern, unspecified headache type: -Patient with worsening headaches since discharge from hospital on 03/07/2020 related to fall, injury to head, and concussion. -Patient seen by neurology on 03/14/2020 and prescribed Depakote to help with headaches  and mood. Patient endorses taking this as prescribed.  -Aspirin-Acetaminophen-Caffeine for headache in addition to continued Depakote.  -Follow-up with primary provider in 4 to 6 weeks or sooner if needed.  - aspirin-acetaminophen-caffeine (EXCEDRIN EXTRA STRENGTH) 250-250-65 MG tablet; Take 1 tablet by mouth every 6 (six) hours as needed for headache.  Dispense: 30 tablet; Refill: 0  Patient was given the opportunity to ask questions.  Patient verbalized understanding of the plan and was able to repeat key elements of the plan. Patient was given clear instructions to go to Emergency Department or return to medical center if symptoms don't improve, worsen, or new problems develop.The patient verbalized understanding.  Rema Fendt, NP

## 2020-03-28 NOTE — Progress Notes (Signed)
Pt states he wakes up in the morning with headache on a pain scale of 7  Pt states he takes Tylenol to help with the pain and then the pain comes back

## 2020-04-24 ENCOUNTER — Other Ambulatory Visit: Payer: Self-pay

## 2020-04-24 ENCOUNTER — Encounter (HOSPITAL_COMMUNITY): Payer: Self-pay

## 2020-04-24 ENCOUNTER — Ambulatory Visit (HOSPITAL_COMMUNITY)
Admission: EM | Admit: 2020-04-24 | Discharge: 2020-04-24 | Disposition: A | Discharge status: Home / Self Care | Payer: Medicaid Other | Attending: Family Medicine | Admitting: Family Medicine

## 2020-04-24 DIAGNOSIS — G40909 Epilepsy, unspecified, not intractable, without status epilepticus: Secondary | ICD-10-CM | POA: Diagnosis not present

## 2020-04-24 DIAGNOSIS — F17210 Nicotine dependence, cigarettes, uncomplicated: Secondary | ICD-10-CM | POA: Diagnosis not present

## 2020-04-24 DIAGNOSIS — R112 Nausea with vomiting, unspecified: Secondary | ICD-10-CM | POA: Diagnosis not present

## 2020-04-24 DIAGNOSIS — J45909 Unspecified asthma, uncomplicated: Secondary | ICD-10-CM | POA: Insufficient documentation

## 2020-04-24 DIAGNOSIS — F319 Bipolar disorder, unspecified: Secondary | ICD-10-CM | POA: Insufficient documentation

## 2020-04-24 DIAGNOSIS — Z7952 Long term (current) use of systemic steroids: Secondary | ICD-10-CM | POA: Insufficient documentation

## 2020-04-24 DIAGNOSIS — Z7982 Long term (current) use of aspirin: Secondary | ICD-10-CM | POA: Diagnosis not present

## 2020-04-24 DIAGNOSIS — Z20822 Contact with and (suspected) exposure to covid-19: Secondary | ICD-10-CM | POA: Diagnosis not present

## 2020-04-24 DIAGNOSIS — M542 Cervicalgia: Secondary | ICD-10-CM | POA: Diagnosis present

## 2020-04-24 DIAGNOSIS — F639 Impulse disorder, unspecified: Secondary | ICD-10-CM | POA: Insufficient documentation

## 2020-04-24 DIAGNOSIS — M5412 Radiculopathy, cervical region: Secondary | ICD-10-CM | POA: Insufficient documentation

## 2020-04-24 DIAGNOSIS — F909 Attention-deficit hyperactivity disorder, unspecified type: Secondary | ICD-10-CM | POA: Insufficient documentation

## 2020-04-24 DIAGNOSIS — Z79899 Other long term (current) drug therapy: Secondary | ICD-10-CM | POA: Diagnosis not present

## 2020-04-24 LAB — SARS CORONAVIRUS 2 (TAT 6-24 HRS): SARS Coronavirus 2: NEGATIVE

## 2020-04-24 MED ORDER — TIZANIDINE HCL 4 MG PO TABS: 4.0000 mg | ORAL_TABLET | Freq: Four times a day (QID) | ORAL | 0 refills | Status: DC | PRN

## 2020-04-24 MED ORDER — ONDANSETRON HCL 4 MG PO TABS: 4.0000 mg | ORAL_TABLET | Freq: Four times a day (QID) | ORAL | 0 refills | Status: DC

## 2020-04-24 MED ORDER — HYDROCODONE-ACETAMINOPHEN 5-325 MG PO TABS
1.0000 | ORAL_TABLET | Freq: Four times a day (QID) | ORAL | 0 refills | Status: DC | PRN
Start: 2020-04-24 — End: 2021-01-15

## 2020-04-24 MED ORDER — PREDNISONE 20 MG PO TABS: 20.0000 mg | ORAL_TABLET | Freq: Two times a day (BID) | ORAL | 0 refills | Status: DC

## 2020-04-24 NOTE — ED Provider Notes (Signed)
[facility]    CSN: [id number] Arrival date & time: 04/24/20  3559      History   Chief Complaint Chief Complaint  Patient presents with  . sob, back pain    HPI Wesley Clarke is a 34 y.o. male.   HPI  Patient states he has a history of impulse control disorder, ADHD, asthma, and seizures.  He states he is also on Depakote to "stabilize my mood".  He was seen in the emergency room in July of this month for a head injury where he fell off of a ladder.  He states he does not think he has any injury remaining from that fall. He is here today for a couple of issues.  He has pain in his neck and shoulder.  It goes from the lower part of his neck To the shoulder with some tingling sensation.  Also goes down the middle of his back.  He points to the area of his upper trapezius muscle.  States one time he had some tingling that went into his hand but this was brief.  States he does a lot of heavy lifting at work and feels this may be related.  He had no recent trauma or incident that might have caused this pain.  He states he also has pain across his low back.  He states that these have both been present for a week, off and on.  Last night he started vomiting.  He has vomited 6 times today.  He states that he had a spell where he was short of breath last night.  Used his albuterol inhaler and walked around.  His breathing now feels normal.  He does have pain with deep breath in his thoracic region, back more so on the right.  No current shortness of breath.  No headache, body aches, fever chills or signs of infection.  He is not Covid vaccinated.  Past Medical History:  Diagnosis Date  . ADHD (attention deficit hyperactivity disorder)   . Asthma   . Epileptic seizures (HCC)   . Head injury 03/07/2020   fell off ladder  . Impulse control disorder   . 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HCC)     Patient Active Problem List   Diagnosis Date Noted  . TOBACCO USER 05/20/2009  . RHINITIS,  ALLERGIC, DUE TO POLLEN 06/15/2007  . 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 ATTENTION DEFICIT, W/HYPERACTIVITY 10/30/2006  . CONVULSIONS, SEIZURES, NOS 10/30/2006    History reviewed. No pertinent surgical history.     Home Medications    Prior to Admission medications   Medication Sig Start Date End Date Taking? Authorizing Provider  albuterol (PROVENTIL) (2.5 MG/3ML) 0.083% nebulizer solution Take 3 mLs (2.5 mg total) by nebulization every 6 (six) hours as needed for wheezing or shortness of breath. 11/17/18   Eustace Moore, MD  albuterol (VENTOLIN HFA) 108 (90 Base) MCG/ACT inhaler Inhale 1-2 puffs into the lungs every 6 (six) hours as needed for wheezing or shortness of breath. 04/26/19   Mickie Bail, NP  aspirin-acetaminophen-caffeine (EXCEDRIN EXTRA STRENGTH) [phone] MG tablet Take 1 tablet by mouth every 6 (six) hours as needed for headache. 03/28/20   Rema Fendt, NP  divalproex (DEPAKOTE) 500 MG DR tablet Take 1 tablet (500 mg total) by mouth at bedtime. 03/14/20   Penumalli, Glenford Bayley, MD  HYDROcodone-acetaminophen (NORCO/VICODIN) 5-325 MG tablet Take 1-2 tablets by mouth every 6 (six) hours as needed. 04/24/20   Eustace Moore, MD  hydrOXYzine (  ATARAX/VISTARIL) 25 MG tablet Take 0.5-1 tablets (12.5-25 mg total) by mouth at bedtime as needed (insomnia). Patient not taking: Reported on 03/14/2020 02/15/20   Wallis Bamberg, PA-C  ondansetron (ZOFRAN) 4 MG tablet Take 1-2 tablets (4-8 mg total) by mouth every 6 (six) hours. As needed nausea 04/24/20   Eustace Moore, MD  predniSONE (DELTASONE) 20 MG tablet Take 1 tablet (20 mg total) by mouth 2 (two) times daily with a meal. 04/24/20   Eustace Moore, MD  tiZANidine (ZANAFLEX) 4 MG tablet Take 1-2 tablets (4-8 mg total) by mouth every 6 (six) hours as needed for muscle spasms. 04/24/20   Eustace Moore, MD    Family History No family history on file.  Social History Social History   Tobacco Use  . Smoking  status: Current Every Day Smoker    Packs/day: 1.00    Types: Cigarettes  . Smokeless tobacco: Never Used  Substance Use Topics  . Alcohol use: No  . Drug use: Not Currently    Types: Marijuana    Comment: 04/01/20 "when my body is in pain, maybe 2 x q/ 6 months"     Allergies   Patient has no known allergies.   Review of Systems Review of Systems  See HPI Physical Exam Triage Vital Signs ED Triage Vitals [04/24/20 0954]  Enc Vitals Group     BP (!) 148/90     Pulse Rate 76     Resp 18     Temp 98.2 F (36.8 C)     Temp Source Oral     SpO2 96 %     Weight 206 lb (93.4 kg)     Height 5\' 11"  (1.803 m)     Head Circumference      Peak Flow      Pain Score 7     Pain Loc      Pain Edu?      Excl. in GC?    No data found.  Updated Vital Signs BP (!) 148/90   Pulse 76   Temp 98.2 F (36.8 C) (Oral)   Resp 18   Ht 5\' 11"  (1.803 m)   Wt 93.4 kg   SpO2 96%   BMI 28.73 kg/m        Physical Exam Constitutional:      General: He is not in acute distress.    Appearance: Normal appearance. He is well-developed.     Comments: Patient appears uncomfortable.  Changes position frequently  HENT:     Head: Normocephalic and atraumatic.     Mouth/Throat:     Comments: Mask is in place Eyes:     Conjunctiva/sclera: Conjunctivae normal.     Pupils: Pupils are equal, round, and reactive to light.  Neck:     Comments: Slow but full range of motion.  Tenderness in the upper body of the trapezius.  The tenderness extends down the medial border of the scapula.  Shoulder has full range of motion. Cardiovascular:     Rate and Rhythm: Normal rate and regular rhythm.  Pulmonary:     Effort: Pulmonary effort is normal. No respiratory distress.     Breath sounds: Normal breath sounds.  Musculoskeletal:        General: Normal range of motion.     Cervical back: Normal range of motion. Tenderness present.  Skin:    General: Skin is warm and dry.  Neurological:      General: No focal deficit present.  Mental Status: He is alert.  Psychiatric:        Mood and Affect: Mood normal.        Behavior: Behavior normal.      UC Treatments / Results  Labs (all labs ordered are listed, but only abnormal results are displayed) Labs Reviewed  SARS CORONAVIRUS 2 (TAT 6-24 HRS)    EKG   Radiology No results found.  Procedures Procedures (including critical care time)  Medications Ordered in UC Medications - No data to display  Initial Impression / Assessment and Plan / UC Course  I have reviewed the triage vital signs and the nursing notes.  Pertinent labs & imaging results that were available during my care of the patient were reviewed by me and considered in my medical decision making (see chart for details).     I reviewed with the patient that a large he has musculoskeletal pain from his heavy work.  He has strain in neck and upper back muscles.  Tenderness to palpation.  We will treat this conservatively with unclear reason for vomiting.  Will treat with Zofran.  Thank you Final Clinical Impressions(s) / UC Diagnoses   Final diagnoses:  Musculoskeletal neck pain  Cervical radiculitis  Non-intractable vomiting with nausea, unspecified vomiting type     Discharge Instructions     Take prednisone 2 times a day.  Take 2 doses today.  This will help with the nerve inflammation Take Zofran as needed for nausea and vomiting.  It is important to keep down your medications and fluids to prevent dehydration Take tizanidine as needed as muscle relaxer.  This is useful at bedtime. Take hydrocodone if pain is severe.  Do not drive on hydrocodone.  Hydrocodone can cause drowsiness. Use heat to painful muscles in your neck and shoulder Call or return if not improving in a few days   ED Prescriptions    Medication Sig Dispense Auth. Provider   predniSONE (DELTASONE) 20 MG tablet Take 1 tablet (20 mg total) by mouth 2 (two) times daily with a  meal. 10 tablet Eustace Moore, MD   tiZANidine (ZANAFLEX) 4 MG tablet Take 1-2 tablets (4-8 mg total) by mouth every 6 (six) hours as needed for muscle spasms. 21 tablet Eustace Moore, MD   ondansetron (ZOFRAN) 4 MG tablet Take 1-2 tablets (4-8 mg total) by mouth every 6 (six) hours. As needed nausea 12 tablet Eustace Moore, MD   HYDROcodone-acetaminophen (NORCO/VICODIN) 5-325 MG tablet Take 1-2 tablets by mouth every 6 (six) hours as needed. 10 tablet Eustace Moore, MD     I have reviewed the PDMP during this encounter.   Eustace Moore, MD 04/24/20 1045

## 2020-04-24 NOTE — Discharge Instructions (Signed)
Take prednisone 2 times a day.  Take 2 doses today.  This will help with the nerve inflammation Take Zofran as needed for nausea and vomiting.  It is important to keep down your medications and fluids to prevent dehydration Take tizanidine as needed as muscle relaxer.  This is useful at bedtime. Take hydrocodone if pain is severe.  Do not drive on hydrocodone.  Hydrocodone can cause drowsiness. Use heat to painful muscles in your neck and shoulder Call or return if not improving in a few days

## 2020-04-24 NOTE — ED Triage Notes (Signed)
Pt c/o 7/10 sharp pain in behind right shoulder and lower backx3 days. Pt states pain in lower back is worse w/sitting. Pt denies injury. Pt c/o numbness and tingling in right shoulder. Pt states he does heavy lifting for his job. Pt was able to walk to exam room. Pt c/o SOB at 0300 this morning. Pt states tried using inh and neb and said it has helped some. Pt c/o vomiting at 0300 today. Pt states vomited 6 times today.

## 2020-05-11 ENCOUNTER — Encounter: Payer: Medicaid Other | Admitting: Internal Medicine

## 2020-08-09 ENCOUNTER — Other Ambulatory Visit: Payer: Self-pay

## 2020-08-09 ENCOUNTER — Ambulatory Visit (INDEPENDENT_AMBULATORY_CARE_PROVIDER_SITE_OTHER): Payer: Medicaid Other

## 2020-08-09 ENCOUNTER — Encounter (HOSPITAL_COMMUNITY): Payer: Self-pay | Admitting: Emergency Medicine

## 2020-08-09 ENCOUNTER — Ambulatory Visit (HOSPITAL_COMMUNITY)
Admission: EM | Admit: 2020-08-09 | Discharge: 2020-08-09 | Disposition: A | Discharge status: Home / Self Care | Payer: Medicaid Other | Attending: Internal Medicine | Admitting: Internal Medicine

## 2020-08-09 DIAGNOSIS — R0602 Shortness of breath: Secondary | ICD-10-CM | POA: Diagnosis not present

## 2020-08-09 DIAGNOSIS — R0789 Other chest pain: Secondary | ICD-10-CM | POA: Diagnosis present

## 2020-08-09 DIAGNOSIS — F17210 Nicotine dependence, cigarettes, uncomplicated: Secondary | ICD-10-CM | POA: Diagnosis not present

## 2020-08-09 DIAGNOSIS — Z7952 Long term (current) use of systemic steroids: Secondary | ICD-10-CM | POA: Insufficient documentation

## 2020-08-09 DIAGNOSIS — R079 Chest pain, unspecified: Secondary | ICD-10-CM | POA: Diagnosis not present

## 2020-08-09 DIAGNOSIS — J45909 Unspecified asthma, uncomplicated: Secondary | ICD-10-CM | POA: Diagnosis not present

## 2020-08-09 DIAGNOSIS — Z79899 Other long term (current) drug therapy: Secondary | ICD-10-CM | POA: Insufficient documentation

## 2020-08-09 DIAGNOSIS — Z20822 Contact with and (suspected) exposure to covid-19: Secondary | ICD-10-CM | POA: Insufficient documentation

## 2020-08-09 LAB — SARS CORONAVIRUS 2 (TAT 6-24 HRS): SARS Coronavirus 2: NEGATIVE

## 2020-08-09 MED ORDER — ALBUTEROL SULFATE (2.5 MG/3ML) 0.083% IN NEBU: 2.5000 mg | INHALATION_SOLUTION | RESPIRATORY_TRACT | 1 refills | Status: DC | PRN

## 2020-08-09 NOTE — Discharge Instructions (Addendum)
Continue using albuterol nebulizer as needed for shortness of breath. Your chest xray does not show any evidence of pneumonia. Your ekg looks good. Use nebulizer as needed for shortness of breath. If symptoms worsen then you will need to go immediately to the emergency department. You can take Ibuprofen 600mg  every 8 hours as needed for your chest pain.  COVID testing ordered. It will take between 3-7 days for test results. Someone will contact you regarding abnormal results or you can access your results through MyChart.   In the meantime you should... Remain isolated in your home for 10 days from symptom onset AND greater than 48 hours of no fever without the use of fever-reducing medication Get plenty of rest and fluids Flonase for nasal congestion and/or runny nose You can take OTC Zyrtec-D for nasal congestion, runny nose, and/or sore throat Use these medications as directed for symptom relief Use Tylenol or Ibuprofen as needed for fever or pain Return or go to the ER for any worsening or new symptoms such as high fever, worsening cough, shortness of breath, chest tightness, chest pain, changes in mental status, etc.

## 2020-08-09 NOTE — ED Triage Notes (Signed)
Patient presents to [facility] for assessment of cough (patient states related to his smoking), SOB, and chest pain starting this morning.  Denies recent illness.  States he has a nebulizer machine at home and did a treatment at home which made him comfortable enough to come here.  Patient c/o 7/10 chest pain, described as pressure.  Denies n/v, denies lightheadedness or dizziness, c/o right scapular pain (for a long time), denies jaw pain or arm pain.

## 2020-08-09 NOTE — ED Provider Notes (Signed)
[facility]    CSN: [id number] Arrival date & time: 08/09/20  1013      History   Chief Complaint Chief Complaint  Patient presents with  . Shortness of Breath  . Chest Pain    HPI Wesley Clarke is a 34 y.o. male wiith PMH of asthma and tobacco abuse presents to urgent care today with complaints of chest pain and shortness of breath.  Patient states symptoms began upon awakening this morning with SOB and fatigue.  Patient states nebulizer use this morning provided some relief.  He denies any recent fever, chills, rhinorrhea, sore throat, loss of taste or smell.  Chronic cough is unchanged.  Patient describes midsternal chest pain that began this morning, persistent in nature and described as tight.  Pain made worse by any movement. He denies any recent diaphoresis or palpitations.  Patient is not vaccinated against COVID-19.  No history of Covid.  No known sick contacts.   Past Medical History:  Diagnosis Date  . ADHD (attention deficit hyperactivity disorder)   . Asthma   . Epileptic seizures (HCC)   . Head injury 03/07/2020   fell off ladder  . Impulse control disorder   . 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HCC)     Patient Active Problem List   Diagnosis Date Noted  . TOBACCO USER 05/20/2009  . RHINITIS, ALLERGIC, DUE TO POLLEN 06/15/2007  . 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 ATTENTION DEFICIT, W/HYPERACTIVITY 10/30/2006  . CONVULSIONS, SEIZURES, NOS 10/30/2006    History reviewed. No pertinent surgical history.     Home Medications    Prior to Admission medications   Medication Sig Start Date End Date Taking? Authorizing Provider  albuterol (PROVENTIL) (2.5 MG/3ML) 0.083% nebulizer solution Take 3 mLs (2.5 mg total) by nebulization every 4 (four) hours as needed for wheezing or shortness of breath. 08/09/20 08/09/21  Rolla Etienne, NP  albuterol (VENTOLIN HFA) 108 (90 Base) MCG/ACT inhaler Inhale 1-2 puffs into the lungs every 6 (six) hours as needed for  wheezing or shortness of breath. 04/26/19   Mickie Bail, NP  aspirin-acetaminophen-caffeine (EXCEDRIN EXTRA STRENGTH) [phone] MG tablet Take 1 tablet by mouth every 6 (six) hours as needed for headache. 03/28/20   Rema Fendt, NP  divalproex (DEPAKOTE) 500 MG DR tablet Take 1 tablet (500 mg total) by mouth at bedtime. 03/14/20   Penumalli, Glenford Bayley, MD  HYDROcodone-acetaminophen (NORCO/VICODIN) 5-325 MG tablet Take 1-2 tablets by mouth every 6 (six) hours as needed. 04/24/20   Eustace Moore, MD  hydrOXYzine (ATARAX/VISTARIL) 25 MG tablet Take 0.5-1 tablets (12.5-25 mg total) by mouth at bedtime as needed (insomnia). Patient not taking: Reported on 03/14/2020 02/15/20   Wallis Bamberg, PA-C  ondansetron (ZOFRAN) 4 MG tablet Take 1-2 tablets (4-8 mg total) by mouth every 6 (six) hours. As needed nausea 04/24/20   Eustace Moore, MD  predniSONE (DELTASONE) 20 MG tablet Take 1 tablet (20 mg total) by mouth 2 (two) times daily with a meal. 04/24/20   Eustace Moore, MD  tiZANidine (ZANAFLEX) 4 MG tablet Take 1-2 tablets (4-8 mg total) by mouth every 6 (six) hours as needed for muscle spasms. 04/24/20   Eustace Moore, MD    Family History Family History  Problem Relation Age of Onset  . Hypertension Mother     Social History Social History   Tobacco Use  . Smoking status: Current Every Day Smoker    Packs/day: 1.00    Types: Cigarettes  . Smokeless  tobacco: Never Used  Substance Use Topics  . Alcohol use: No  . Drug use: Not Currently    Types: Marijuana    Comment: 04/01/20 "when my body is in pain, maybe 2 x q/ 6 months"     Allergies   Patient has no known allergies.   Review of Systems As stated in HPI otherwise negative   Physical Exam Triage Vital Signs ED Triage Vitals  Enc Vitals Group     BP --      Pulse Rate 08/09/20 1021 90     Resp 08/09/20 1021 18     Temp 08/09/20 1021 97.8 F (36.6 C)     Temp Source 08/09/20 1021 Temporal     SpO2 08/09/20  1021 98 %     Weight --      Height --      Head Circumference --      Peak Flow --      Pain Score 08/09/20 1022 8     Pain Loc --      Pain Edu? --      Excl. in GC? --    No data found.  Updated Vital Signs Pulse 90   Temp 97.8 F (36.6 C) (Temporal)   Resp 18   SpO2 98%      Physical Exam Constitutional:      General: He is not in acute distress.    Appearance: He is well-developed. He is not ill-appearing, toxic-appearing or diaphoretic.  HENT:     Head: Normocephalic and atraumatic.     Mouth/Throat:     Mouth: Mucous membranes are moist.     Pharynx: Oropharynx is clear.  Eyes:     Extraocular Movements: Extraocular movements intact.  Cardiovascular:     Rate and Rhythm: Normal rate and regular rhythm.     Heart sounds: Normal heart sounds. No murmur heard.  No friction rub. No gallop.   Pulmonary:     Effort: Pulmonary effort is normal.     Breath sounds: No wheezing, rhonchi or rales.     Comments: Diminished lung sounds bilateral lower lobes without any wheezing rales or crackles.  No increased work of breathing Chest:     Chest wall: Tenderness present.     Comments: Quite tender upon palpation of intercostal spaces and left chest wall Abdominal:     General: Bowel sounds are normal.     Palpations: Abdomen is soft.  Musculoskeletal:        General: Normal range of motion.     Cervical back: Normal range of motion and neck supple.  Skin:    General: Skin is warm and dry.  Neurological:     General: No focal deficit present.     Mental Status: He is alert.  Psychiatric:        Mood and Affect: Mood normal.      UC Treatments / Results  Labs (all labs ordered are listed, but only abnormal results are displayed) Labs Reviewed - No data to display  EKG NSR at 92bpm, no twave inversion or ischemic changes  Radiology DG Chest 2 View  Result Date: 08/09/2020 CLINICAL DATA:  Shortness of breath, chest pain EXAM: CHEST - 2 VIEW COMPARISON:   02/08/2019 FINDINGS: Heart and mediastinal contours are within normal limits. No focal opacities or effusions. No acute bony abnormality. IMPRESSION: No active cardiopulmonary disease. Electronically Signed   By: Charlett Nose M.D.   On: 08/09/2020 11:32    Procedures  Procedures (including critical care time)  Medications Ordered in UC Medications - No data to display  Initial Impression / Assessment and Plan / UC Course  I have reviewed the triage vital signs and the nursing notes.  Pertinent labs & imaging results that were available during my care of the patient were reviewed by me and considered in my medical decision making (see chart for details).  SOB Chest wall pain  -Uncertain if related to chronic asthma/allergies and/or viral illness -Patient nontoxic-appearing, afebrile, exam unremarkable aside from diminished lung sounds likely related to chronic tobacco abuse. -No evidence of pneumonia on chest x-ray.  EKG nonischemic and chest wall pain easily reproduced. -Patient declined IM Toradol.  Discussed NSAID use for chest wall pain -No wheezing on exam that with warrant steroids -Concern for Covid based on symptoms and patient unvaccinated.  Will send PCR.  Isolation precautions discussed -Continue nebulizer as needed for shortness of breath, prescription refilled -Patient would likely benefit by adding Zyrtec to daily regimen -Return or go to ER for worsening symptoms  Final Clinical Impressions(s) / UC Diagnoses   Final diagnoses:  Shortness of breath  Chest wall pain     Discharge Instructions     Continue using albuterol nebulizer as needed for shortness of breath. Your chest xray does not show any evidence of pneumonia. Your ekg looks good. Use nebulizer as needed for shortness of breath. If symptoms worsen then you will need to go immediately to the emergency department. You can take Ibuprofen 600mg  every 8 hours as needed for your chest pain.  COVID testing  ordered. It will take between 3-7 days for test results. Someone will contact you regarding abnormal results or you can access your results through MyChart.   In the meantime you should... . Remain isolated in your home for 10 days from symptom onset AND greater than 48 hours of no fever without the use of fever-reducing medication . Get plenty of rest and fluids . Flonase for nasal congestion and/or runny nose . You can take OTC Zyrtec-D for nasal congestion, runny nose, and/or sore throat . Use these medications as directed for symptom relief . Use Tylenol or Ibuprofen as needed for fever or pain . Return or go to the ER for any worsening or new symptoms such as high fever, worsening cough, shortness of breath, chest tightness, chest pain, changes in mental status, etc.     ED Prescriptions    Medication Sig Dispense Auth. Provider   albuterol (PROVENTIL) (2.5 MG/3ML) 0.083% nebulizer solution Take 3 mLs (2.5 mg total) by nebulization every 4 (four) hours as needed for wheezing or shortness of breath. 75 mL , NP     PDMP not reviewed this encounter.   Rolla Etienne, NP 08/09/20 1153

## 2021-01-08 ENCOUNTER — Emergency Department (HOSPITAL_COMMUNITY): Payer: Medicaid Other

## 2021-01-08 ENCOUNTER — Emergency Department (HOSPITAL_COMMUNITY)
Admission: EM | Admit: 2021-01-08 | Discharge: 2021-01-08 | Disposition: A | Discharge status: Home / Self Care | Payer: Medicaid Other | Attending: Emergency Medicine | Admitting: Emergency Medicine

## 2021-01-08 ENCOUNTER — Other Ambulatory Visit: Payer: Self-pay

## 2021-01-08 DIAGNOSIS — R0789 Other chest pain: Secondary | ICD-10-CM | POA: Insufficient documentation

## 2021-01-08 DIAGNOSIS — J45909 Unspecified asthma, uncomplicated: Secondary | ICD-10-CM | POA: Diagnosis not present

## 2021-01-08 DIAGNOSIS — S46002A Unspecified injury of muscle(s) and tendon(s) of the rotator cuff of left shoulder, initial encounter: Secondary | ICD-10-CM | POA: Diagnosis not present

## 2021-01-08 DIAGNOSIS — F17210 Nicotine dependence, cigarettes, uncomplicated: Secondary | ICD-10-CM | POA: Insufficient documentation

## 2021-01-08 DIAGNOSIS — Z7982 Long term (current) use of aspirin: Secondary | ICD-10-CM | POA: Diagnosis not present

## 2021-01-08 DIAGNOSIS — X58XXXA Exposure to other specified factors, initial encounter: Secondary | ICD-10-CM | POA: Diagnosis not present

## 2021-01-08 DIAGNOSIS — M25512 Pain in left shoulder: Secondary | ICD-10-CM

## 2021-01-08 DIAGNOSIS — R110 Nausea: Secondary | ICD-10-CM | POA: Insufficient documentation

## 2021-01-08 LAB — CBC
HCT: 47.4 % (ref 39.0–52.0)
Hemoglobin: 16 g/dL (ref 13.0–17.0)
MCH: 29.9 pg (ref 26.0–34.0)
MCHC: 33.8 g/dL (ref 30.0–36.0)
MCV: 88.4 fL (ref 80.0–100.0)
Platelets: 186 10*3/uL (ref 150–400)
RBC: 5.36 MIL/uL (ref 4.22–5.81)
RDW: 12.8 % (ref 11.5–15.5)
WBC: 9.4 10*3/uL (ref 4.0–10.5)
nRBC: 0 % (ref 0.0–0.2)

## 2021-01-08 LAB — BASIC METABOLIC PANEL
Anion gap: 5 (ref 5–15)
BUN: 10 mg/dL (ref 6–20)
CO2: 24 mmol/L (ref 22–32)
Calcium: 9.3 mg/dL (ref 8.9–10.3)
Chloride: 109 mmol/L (ref 98–111)
Creatinine, Ser: 0.74 mg/dL (ref 0.61–1.24)
GFR, Estimated: >60 mL/min (ref >60)
Glucose, Bld: 87 mg/dL (ref 70–99)
Potassium: 4.5 mmol/L (ref 3.5–5.1)
Sodium: 138 mmol/L (ref 135–145)

## 2021-01-08 LAB — TROPONIN I (HIGH SENSITIVITY)
Troponin I (High Sensitivity): 3 ng/L (ref <18)
Troponin I (High Sensitivity): 3 ng/L (ref <18)

## 2021-01-08 MED ORDER — ASPIRIN 81 MG PO CHEW
324.0000 mg | CHEWABLE_TABLET | Freq: Once | ORAL | Status: AC
  Administered 2021-01-08: 324 mg via ORAL
  Filled 2021-01-08: qty 4

## 2021-01-08 MED ORDER — NITROGLYCERIN 0.4 MG SL SUBL
0.4000 mg | SUBLINGUAL_TABLET | SUBLINGUAL | Status: DC | PRN
  Administered 2021-01-08: 0.4 mg via SUBLINGUAL
  Filled 2021-01-08: qty 1

## 2021-01-08 MED ORDER — HYDROCODONE-ACETAMINOPHEN 5-325 MG PO TABS
1.0000 | ORAL_TABLET | Freq: Once | ORAL | Status: AC
  Administered 2021-01-08: 1 via ORAL
  Filled 2021-01-08: qty 1

## 2021-01-08 MED ORDER — NAPROXEN 500 MG PO TABS: 500.0000 mg | ORAL_TABLET | Freq: Two times a day (BID) | ORAL | 0 refills | Status: DC

## 2021-01-08 NOTE — ED Notes (Signed)
Patient in radiology, not yet roomed.

## 2021-01-08 NOTE — ED Provider Notes (Signed)
[facility] EMERGENCY DEPARTMENT Provider Note   CSN: [id number] Arrival date & time: 01/08/21  4098     History Chief Complaint  Patient presents with  . Chest Pain    Wesley Clarke is a 35 y.o. male.  Patient with onset of left anterior chest pain radiating to left arm on Friday.  Had almost all day Friday.  No real problems on Saturday or Sunday.  Recurred this morning at 6 in the morning.  Is been constant.  It was also constant on Friday.  Not associated with shortness of breath no vomiting but some slight nausea.  Patient also states it is made worse by moving his left arm.  And that pain seems to be predominantly in the left shoulder area.  Past medical history significant for attention deficit disorder asthma epileptic seizures manic depression impulse control disorder.  Patient is a smoker        Past Medical History:  Diagnosis Date  . ADHD (attention deficit hyperactivity disorder)   . Asthma   . Epileptic seizures (HCC)   . Head injury 03/07/2020   fell off ladder  . Impulse control disorder   . 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HCC)     Patient Active Problem List   Diagnosis Date Noted  . TOBACCO USER 05/20/2009  . RHINITIS, ALLERGIC, DUE TO POLLEN 06/15/2007  . 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 ATTENTION DEFICIT, W/HYPERACTIVITY 10/30/2006  . CONVULSIONS, SEIZURES, NOS 10/30/2006    No past surgical history on file.     Family History  Problem Relation Age of Onset  . Hypertension Mother     Social History   Tobacco Use  . Smoking status: Current Every Day Smoker    Packs/day: 1.00    Types: Cigarettes  . Smokeless tobacco: Never Used  Substance Use Topics  . Alcohol use: No  . Drug use: Not Currently    Types: Marijuana    Comment: 04/01/20 "when my body is in pain, maybe 2 x q/ 6 months"    Home Medications Prior to Admission medications   Medication Sig Start Date End Date Taking? Authorizing Provider  albuterol  (PROVENTIL) (2.5 MG/3ML) 0.083% nebulizer solution Take 3 mLs (2.5 mg total) by nebulization every 4 (four) hours as needed for wheezing or shortness of breath. 08/09/20 08/09/21  Rolla Etienne, NP  albuterol (VENTOLIN HFA) 108 (90 Base) MCG/ACT inhaler Inhale 1-2 puffs into the lungs every 6 (six) hours as needed for wheezing or shortness of breath. 04/26/19   Mickie Bail, NP  aspirin-acetaminophen-caffeine (EXCEDRIN EXTRA STRENGTH) [phone] MG tablet Take 1 tablet by mouth every 6 (six) hours as needed for headache. 03/28/20   Rema Fendt, NP  divalproex (DEPAKOTE) 500 MG DR tablet Take 1 tablet (500 mg total) by mouth at bedtime. 03/14/20   Penumalli, Glenford Bayley, MD  HYDROcodone-acetaminophen (NORCO/VICODIN) 5-325 MG tablet Take 1-2 tablets by mouth every 6 (six) hours as needed. 04/24/20   Eustace Moore, MD  hydrOXYzine (ATARAX/VISTARIL) 25 MG tablet Take 0.5-1 tablets (12.5-25 mg total) by mouth at bedtime as needed (insomnia). Patient not taking: Reported on 03/14/2020 02/15/20   Wallis Bamberg, PA-C  ondansetron (ZOFRAN) 4 MG tablet Take 1-2 tablets (4-8 mg total) by mouth every 6 (six) hours. As needed nausea 04/24/20   Eustace Moore, MD  predniSONE (DELTASONE) 20 MG tablet Take 1 tablet (20 mg total) by mouth 2 (two) times daily with a meal. 04/24/20   Eustace Moore, MD  tiZANidine (ZANAFLEX) 4 MG tablet Take 1-2 tablets (4-8 mg total) by mouth every 6 (six) hours as needed for muscle spasms. 04/24/20   Eustace Moore, MD    Allergies    Patient has no known allergies.  Review of Systems   Review of Systems  Constitutional: Negative for chills and fever.  HENT: Negative for congestion, rhinorrhea and sore throat.   Eyes: Negative for visual disturbance.  Respiratory: Negative for cough and shortness of breath.   Cardiovascular: Positive for chest pain. Negative for leg swelling.  Gastrointestinal: Positive for nausea. Negative for abdominal pain, diarrhea and vomiting.   Genitourinary: Negative for dysuria.  Musculoskeletal: Negative for back pain and neck pain.  Skin: Negative for rash.  Neurological: Negative for dizziness, light-headedness and headaches.  Hematological: Does not bruise/bleed easily.  Psychiatric/Behavioral: Negative for confusion.    Physical Exam Updated Vital Signs BP (!) 152/110 (BP Location: Right Arm)   Pulse 78   Temp 98.4 F (36.9 C) (Oral)   Resp 12   Ht 1.803 m (5\' 11" )   Wt 94 kg   SpO2 99%   BMI 28.90 kg/m   Physical Exam Vitals and nursing note reviewed.  Constitutional:      Appearance: Normal appearance. He is well-developed.  HENT:     Head: Normocephalic and atraumatic.  Eyes:     Extraocular Movements: Extraocular movements intact.     Conjunctiva/sclera: Conjunctivae normal.     Pupils: Pupils are equal, round, and reactive to light.  Cardiovascular:     Rate and Rhythm: Normal rate and regular rhythm.     Heart sounds: No murmur heard.   Pulmonary:     Effort: Pulmonary effort is normal. No respiratory distress.     Breath sounds: Normal breath sounds. No wheezing.  Chest:     Chest wall: No tenderness.  Abdominal:     Palpations: Abdomen is soft.     Tenderness: There is no abdominal tenderness.  Musculoskeletal:        General: No swelling or deformity.     Cervical back: Normal range of motion and neck supple.     Comments: Left upper extremity radial pulses 2+.  Good movement of fingers wrist and elbow.  However with movement at the shoulder prickly if you try to elevate the arm above the head patient gets increased discomfort.  Skin:    General: Skin is warm and dry.     Capillary Refill: Capillary refill takes less than 2 seconds.  Neurological:     Mental Status: He is alert.     Cranial Nerves: No cranial nerve deficit.     Sensory: No sensory deficit.     Motor: No weakness.     ED Results / Procedures / Treatments   Labs (all labs ordered are listed, but only abnormal  results are displayed) Labs Reviewed  BASIC METABOLIC PANEL  CBC  TROPONIN I (HIGH SENSITIVITY)    EKG EKG Interpretation  Date/Time:  Monday Jan 08 2021 09:51:52 EDT Ventricular Rate:  81 PR Interval:  157 QRS Duration: 85 QT Interval:  362 QTC Calculation: 421 R Axis:   65 Text Interpretation: Sinus rhythm ST elev, probable normal early repol pattern No significant change since last tracing Confirmed by [birth year] [phone]) on 01/08/2021 10:00:13 AM   Radiology DG Chest 2 View  Result Date: 01/08/2021 CLINICAL DATA:  Chest pain EXAM: CHEST - 2 VIEW COMPARISON:  08/09/2020 FINDINGS: Scarring at the left base. Right lung  clear. Heart is normal size. No effusions. No acute bony abnormality. IMPRESSION: Left basilar scarring, stable.  No active disease. Electronically Signed   By: Charlett Nose M.D.   On: 01/08/2021 09:54    Procedures Procedures   Medications Ordered in ED Medications  nitroGLYCERIN (NITROSTAT) SL tablet 0.4 mg (0.4 mg Sublingual Given 01/08/21 1006)  aspirin chewable tablet 324 mg (324 mg Oral Given 01/08/21 1007)  HYDROcodone-acetaminophen (NORCO/VICODIN) 5-325 MG per tablet 1 tablet (1 tablet Oral Given 01/08/21 1007)    ED Course  I have reviewed the triage vital signs and the nursing notes.  Pertinent labs & imaging results that were available during my care of the patient were reviewed by me and considered in my medical decision making (see chart for details).    MDM Rules/Calculators/A&P                          Patient anterior chest pain may be related to a left rotator cuff injury.  Based on exam.  But we will work him up to make showed no acute cardiac event.  Or any acute pulmonary event.  Patient is oxygen saturations on room air 98%.  Not tachycardic.  EKG without any acute changes.  Will get troponin CBC basic metabolic panel chest x-ray and x-ray of the left shoulder.  If work-up negative.  We will treat him with a sling and have him follow-up  with orthopedics or sports medicine.  Chest x-ray without any acute findings.  Troponins x2 are negative.  Basic labs without any significant abnormalities.  X-ray of left shoulder without any bony abnormalities.  Clinically feel patient has atypical chest pain but very very well have a left rotator cuff injury.  Will treat with a sling Naprosyn and follow-up with orthopedics.  Final Clinical Impression(s) / ED Diagnoses Final diagnoses:  Atypical chest pain  Acute pain of left shoulder    Rx / DC Orders ED Discharge Orders    None       Vanetta Mulders, MD 01/08/21 1316

## 2021-01-08 NOTE — ED Notes (Signed)
Patient transported to X-ray. Will bring to room when finished

## 2021-01-08 NOTE — ED Notes (Signed)
Pt in xray

## 2021-01-08 NOTE — ED Notes (Signed)
ED Provider at bedside. 

## 2021-01-08 NOTE — ED Triage Notes (Signed)
Pt presents to the ED with chest pain that started on Friday and has gotten worse and now is having radiation down the left arm. Hx of HTN. C/o nausea too.

## 2021-01-08 NOTE — Discharge Instructions (Signed)
Work-up for the chest pain from a cardiac and lung standpoint all negative.  Left shoulder suggestive of rotator cuff injury.  Wear the sling at all times for comfort.  Can take off to shower.  Make an appointment follow-up with orthopedics.  Take the Naprosyn prescribed for the next 7 days to help healing of the shoulder.  Return for any new or worse symptoms

## 2021-01-08 NOTE — ED Notes (Signed)
Patient placed in room form radiology. Refuses IV.

## 2021-01-08 NOTE — ED Notes (Signed)
Shoulder immobilizer/sling given to patient. He did not want to wear it at present.

## 2021-01-15 ENCOUNTER — Encounter: Payer: Self-pay | Admitting: Physician Assistant

## 2021-01-15 ENCOUNTER — Ambulatory Visit (INDEPENDENT_AMBULATORY_CARE_PROVIDER_SITE_OTHER): Payer: Medicaid Other

## 2021-01-15 ENCOUNTER — Ambulatory Visit (INDEPENDENT_AMBULATORY_CARE_PROVIDER_SITE_OTHER): Payer: Medicaid Other | Admitting: Physician Assistant

## 2021-01-15 DIAGNOSIS — G8929 Other chronic pain: Secondary | ICD-10-CM | POA: Diagnosis not present

## 2021-01-15 DIAGNOSIS — M542 Cervicalgia: Secondary | ICD-10-CM

## 2021-01-15 DIAGNOSIS — M25512 Pain in left shoulder: Secondary | ICD-10-CM | POA: Diagnosis not present

## 2021-01-15 DIAGNOSIS — M25511 Pain in right shoulder: Secondary | ICD-10-CM

## 2021-01-15 MED ORDER — METHYLPREDNISOLONE ACETATE 40 MG/ML IJ SUSP
40.0000 mg | INTRAMUSCULAR | Status: AC | PRN
  Administered 2021-01-15: 40 mg via INTRA_ARTICULAR

## 2021-01-15 MED ORDER — LIDOCAINE HCL 1 % IJ SOLN
0.5000 mL | INTRAMUSCULAR | Status: AC | PRN
  Administered 2021-01-15: .5 mL

## 2021-01-15 NOTE — Progress Notes (Addendum)
Office Visit Note   Patient: Wesley Clarke           Date of Birth: [birth year]           MRN: [id number] Visit Date: 01/15/2021              Requested by: No referring provider defined for this encounter. PCP: Patient, No Pcp Per (Inactive)   Assessment & Plan: Visit Diagnoses:  1. Chronic right shoulder pain   2. Neck pain     Plan: We will have him return for follow-up in 2 weeks to see what type of response he had to the injection along with exercises which were shown today.  He shown wall crawls, pendulum, Codman and forward flexion exercises.  Questions encouraged and answered at length.  Follow-Up Instructions: Return in about 2 weeks (around 01/29/2021).   Orders:  Orders Placed This Encounter  Procedures  . Large Joint Inj: bilateral subacromial bursa  . XR Shoulder Right  . XR Cervical Spine 2 or 3 views   No orders of the defined types were placed in this encounter.     Procedures: Large Joint Inj: bilateral subacromial bursa on 01/15/2021 2:41 PM Indications: pain Details: 22 G 1.5 in needle, lateral approach  Arthrogram: No  Medications (Right): 0.5 mL lidocaine 1 %; 40 mg methylPREDNISolone acetate 40 MG/ML Medications (Left): 0.5 mL lidocaine 1 %; 40 mg methylPREDNISolone acetate 40 MG/ML Outcome: tolerated well, no immediate complications Procedure, treatment alternatives, risks and benefits explained, specific risks discussed. Consent was given by the patient. Immediately prior to procedure a time out was called to verify the correct patient, procedure, equipment, support staff and site/side marked as required. Patient was prepped and draped in the usual sterile fashion.       Clinical Data: No additional findings.   Subjective: Chief Complaint  Patient presents with  . Right Shoulder - Pain  . Left Shoulder - Pain    HPI Wesley Clarke is a 35 year old male comes in today with bilateral shoulder pain.  States left shoulder pain is ongoing for  the past 2 weeks.  Right shoulder pains been ongoing for 2 years.  Does report fall off of a ladder onto his left side 3 to 4 months ago.  Otherwise no known injury to either shoulder.  He is right-hand dominant.  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  He has tried ibuprofen and stretching without any real relief.  Denies any numbness tingling either hand.  He does have numbness tingling right shoulder trapezius area and the left anterior shoulder to elbow at times.  Notes that his pain is 7 out of 10 pain is constant.  Notes both shoulders are equal and is far as the amount of pain. Left shoulder radiographs dated 01/08/2021 are reviewed.  Shoulder is well located.  No acute fractures.  No bony abnormalities.  Glenohumeral joint appears well-maintained.  Review of Systems  Constitutional: Negative for chills and fever.  Musculoskeletal: Positive for arthralgias and neck pain.     Objective: Vital Signs: There were no vitals taken for this visit.  Physical Exam Constitutional:      Appearance: He is not ill-appearing or diaphoretic.  Cardiovascular:     Pulses: Normal pulses.  Pulmonary:     Effort: Pulmonary effort is normal.  Neurological:     Mental Status: He is alert and oriented to person, place, and time.  Psychiatric:  Mood and Affect: Mood normal.     Ortho Exam Bilateral hands full sensation full motor.  5 out of 5 strength throughout the upper extremities against resistance.  Forward flexion bilateral shoulders only to 90 degrees actively passively with considerable amount of pain I can range 160 degrees of forward flexion bilaterally.  Impingement testing is painful with the arm first at 90 degrees but actual impingement testing is negative.  Liftoff test negative bilaterally.  5 out of 5 strength with internal and external rotation against resistance empty can test is negative bilaterally.  He has tenderness over the  scapula bilaterally.  He has good range of motion cervical spine without pain.  Nontender about the cervical spinal column paraspinous region.  Negative Spurling's. Specialty Comments:  No specialty comments available.  Imaging: XR Cervical Spine 2 or 3 views  Result Date: 01/15/2021 Cervical spine 2 views: Loss of lordotic curvature.  Otherwise displays well-maintained.  No significant arthritic changes noted.  No bony abnormalities otherwise or acute fractures.  XR Shoulder Right  Result Date: 01/15/2021 Right shoulder 3 views: No acute fractures.  Shoulders well located.  Mild AC joint changes.  Glenohumeral joint is well-maintained.    PMFS History: Patient Active Problem List   Diagnosis Date Noted  . TOBACCO USER 05/20/2009  . RHINITIS, ALLERGIC, DUE TO POLLEN 06/15/2007  . 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 ATTENTION DEFICIT, W/HYPERACTIVITY 10/30/2006  . CONVULSIONS, SEIZURES, NOS 10/30/2006   Past Medical History:  Diagnosis Date  . ADHD (attention deficit hyperactivity disorder)   . Asthma   . Epileptic seizures (HCC)   . Head injury 03/07/2020   fell off ladder  . Impulse control disorder   . 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HCC)     Family History  Problem Relation Age of Onset  . Hypertension Mother     History reviewed. No pertinent surgical history. Social History   Occupational History    Comment: Holiday representative  Tobacco Use  . Smoking status: Current Every Day Smoker    Packs/day: 1.00    Types: Cigarettes  . Smokeless tobacco: Never Used  Substance and Sexual Activity  . Alcohol use: No  . Drug use: Not Currently    Types: Marijuana    Comment: 04/01/20 "when my body is in pain, maybe 2 x q/ 6 months"  . Sexual activity: Not on file

## 2021-01-30 ENCOUNTER — Encounter: Payer: Self-pay | Admitting: Orthopaedic Surgery

## 2021-01-30 ENCOUNTER — Other Ambulatory Visit: Payer: Self-pay

## 2021-01-30 ENCOUNTER — Ambulatory Visit (INDEPENDENT_AMBULATORY_CARE_PROVIDER_SITE_OTHER): Payer: Medicaid Other | Admitting: Orthopaedic Surgery

## 2021-01-30 DIAGNOSIS — Z96611 Presence of right artificial shoulder joint: Secondary | ICD-10-CM

## 2021-01-30 DIAGNOSIS — M25512 Pain in left shoulder: Secondary | ICD-10-CM | POA: Diagnosis not present

## 2021-01-30 DIAGNOSIS — M7541 Impingement syndrome of right shoulder: Secondary | ICD-10-CM

## 2021-01-30 NOTE — Progress Notes (Signed)
Office Visit Note   Patient: Wesley Clarke           Date of Birth: 06-25-1986           MRN: [id number] Visit Date: 01/30/2021              Requested by: No referring provider defined for this encounter. PCP: Patient, No Pcp Per (Inactive)   Assessment & Plan: Visit Diagnoses:  1. Impingement syndrome of right shoulder   2. Acute pain of left shoulder     Plan: Given patient's continued right shoulder pain despite conservative measures which have included time, home exercises and subacromial injection recommend MRI.  MRI of the right shoulder is rule out rotator cuff tear.  Having follow-up after the MRI to go over results and discuss further treatment.  Questions encouraged and answered  Follow-Up Instructions: Return after MRI.   Orders:  No orders of the defined types were placed in this encounter.  No orders of the defined types were placed in this encounter.     Procedures: No procedures performed   Clinical Data: No additional findings.   Subjective: Chief Complaint  Patient presents with  . Right Shoulder - Pain, Follow-up  . Left Shoulder - Pain, Follow-up    HPI Wesley Clarke returns today status post bilateral shoulder subacromial injections.  He states that the left shoulder is much improved.  Right shoulder did well for about 4 days but now the pain is returning.  He states that his pain was at least diminished but 40% right shoulder with the injection.  He states he has been doing the exercises as shown and feels he may have pulled something in his right shoulder.  He has pain with overhead activity of the shoulders but states that his range of motion of the right shoulder is much improved. Review of Systems See HPI otherwise negative or noncontributory.  Objective: Vital Signs: There were no vitals taken for this visit.  Physical Exam Constitutional:      Appearance: He is normal weight. He is not ill-appearing or diaphoretic.  Pulmonary:      Effort: Pulmonary effort is normal.  Neurological:     Mental Status: He is oriented to person, place, and time.  Psychiatric:        Mood and Affect: Mood normal.     Ortho Exam Bilateral shoulders 5 strength with external and internal rotation against resistance.  Empty can test is negative bilaterally but painful on the right nonpainful on the left.  Impingement testing positive on the right negative on the left.  He has full overhead activity of both arms today. Specialty Comments:  No specialty comments available.  Imaging: No results found.   PMFS History: Patient Active Problem List   Diagnosis Date Noted  . TOBACCO USER 05/20/2009  . RHINITIS, ALLERGIC, DUE TO POLLEN 06/15/2007  . 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 ATTENTION DEFICIT, W/HYPERACTIVITY 10/30/2006  . CONVULSIONS, SEIZURES, NOS 10/30/2006   Past Medical History:  Diagnosis Date  . ADHD (attention deficit hyperactivity disorder)   . Asthma   . Epileptic seizures (HCC)   . Head injury 03/07/2020   fell off ladder  . Impulse control disorder   . 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HCC)     Family History  Problem Relation Age of Onset  . Hypertension Mother     History reviewed. No pertinent surgical history. Social History   Occupational History    Comment: Holiday representative  Tobacco Use  . Smoking status:  Current Every Day Smoker    Packs/day: 1.00    Types: Cigarettes  . Smokeless tobacco: Never Used  Substance and Sexual Activity  . Alcohol use: No  . Drug use: Not Currently    Types: Marijuana    Comment: 04/01/20 "when my body is in pain, maybe 2 x q/ 6 months"  . Sexual activity: Not on file

## 2021-02-13 ENCOUNTER — Ambulatory Visit
Admission: RE | Admit: 2021-02-13 | Discharge: 2021-02-13 | Disposition: A | Discharge status: Home / Self Care | Payer: Medicaid Other | Source: Ambulatory Visit | Attending: Orthopaedic Surgery | Admitting: Orthopaedic Surgery

## 2021-02-13 ENCOUNTER — Other Ambulatory Visit: Payer: Self-pay

## 2021-02-13 DIAGNOSIS — Z96611 Presence of right artificial shoulder joint: Secondary | ICD-10-CM

## 2021-02-19 ENCOUNTER — Ambulatory Visit (INDEPENDENT_AMBULATORY_CARE_PROVIDER_SITE_OTHER): Payer: Medicaid Other | Admitting: Orthopaedic Surgery

## 2021-02-19 ENCOUNTER — Other Ambulatory Visit: Payer: Self-pay

## 2021-02-19 ENCOUNTER — Encounter: Payer: Self-pay | Admitting: Orthopaedic Surgery

## 2021-02-19 DIAGNOSIS — M75121 Complete rotator cuff tear or rupture of right shoulder, not specified as traumatic: Secondary | ICD-10-CM | POA: Diagnosis not present

## 2021-02-19 MED ORDER — CYCLOBENZAPRINE HCL 10 MG PO TABS: 10.0000 mg | ORAL_TABLET | Freq: Three times a day (TID) | ORAL | 0 refills | Status: DC | PRN

## 2021-02-19 MED ORDER — NABUMETONE 750 MG PO TABS: 750.0000 mg | ORAL_TABLET | Freq: Two times a day (BID) | ORAL | 1 refills | Status: DC | PRN

## 2021-02-19 NOTE — Progress Notes (Signed)
The patient comes in today to go over a MRI of his right shoulder.  He has had chronic pain for some time of the right shoulder and has been getting worse with weakness lifting overhead and behind him and just severe pain that is worsening he states.  It was warranted to obtain an MRI of his right shoulder at this point given the failed conservative treatment and his worsening weakness and pain.  He had had steroid injections before.  The MRI was reviewed with him of his right shoulder.  He does have a full-thickness and retracted tear of the infraspinatus tendon of the rotator cuff.  This correlates with where he is hurting and correlates with his weakness.  I showed him a shoulder model and went over his MRI and gave him a copy of his MRI report describing what is going on with his right shoulder.  We did recommend a surgical intervention given his continued weakness and pain.  It certainly could be difficult given the amount of retraction of the rotator cuff itself.  He is only 35 years old.  There is no muscle atrophy fortunately and the remainder of her shoulder appears normal.  There is edema associated with this tear showing that is acute or subacute.  Again we describe surgery in detail including the risk and benefits of the surgery and described how it is performed.  He needs to be on light duty work at this point with no lifting greater than 5 to 10 pounds with his right upper extremity and no repetitive overhead activities.  He can drive for work-related duties.  After surgery, he will need to be out of work at least 4 weeks and then will be on light duty for up to 6 months after surgery.  All questions and concerns were answered addressed.  We will work on having the surgery scheduled.  We would then see him back in 1 week postoperative.  I did send in some Flexeril and Relafen to try.

## 2021-02-26 ENCOUNTER — Emergency Department (HOSPITAL_COMMUNITY): Payer: Medicaid Other

## 2021-02-26 ENCOUNTER — Emergency Department (HOSPITAL_COMMUNITY)
Admission: EM | Admit: 2021-02-26 | Discharge: 2021-02-26 | Disposition: A | Discharge status: Home / Self Care | Payer: Medicaid Other | Attending: Emergency Medicine | Admitting: Emergency Medicine

## 2021-02-26 ENCOUNTER — Encounter (HOSPITAL_COMMUNITY): Payer: Self-pay | Admitting: Emergency Medicine

## 2021-02-26 ENCOUNTER — Other Ambulatory Visit: Payer: Self-pay

## 2021-02-26 DIAGNOSIS — F17210 Nicotine dependence, cigarettes, uncomplicated: Secondary | ICD-10-CM | POA: Diagnosis not present

## 2021-02-26 DIAGNOSIS — S3992XA Unspecified injury of lower back, initial encounter: Secondary | ICD-10-CM | POA: Insufficient documentation

## 2021-02-26 DIAGNOSIS — M5380 Other specified dorsopathies, site unspecified: Secondary | ICD-10-CM | POA: Diagnosis not present

## 2021-02-26 DIAGNOSIS — J45909 Unspecified asthma, uncomplicated: Secondary | ICD-10-CM | POA: Insufficient documentation

## 2021-02-26 DIAGNOSIS — W109XXA Fall (on) (from) unspecified stairs and steps, initial encounter: Secondary | ICD-10-CM | POA: Diagnosis not present

## 2021-02-26 MED ORDER — TRAMADOL HCL 50 MG PO TABS: 50.0000 mg | ORAL_TABLET | Freq: Two times a day (BID) | ORAL | 0 refills | Status: AC | PRN

## 2021-02-26 MED ORDER — HYDROCODONE-ACETAMINOPHEN 5-325 MG PO TABS
1.0000 | ORAL_TABLET | Freq: Once | ORAL | Status: AC
  Administered 2021-02-26: 1 via ORAL
  Filled 2021-02-26: qty 1

## 2021-02-26 NOTE — ED Provider Notes (Signed)
[facility] EMERGENCY DEPARTMENT Provider Note   CSN: [id number] Arrival date & time: 02/26/21  3244     History No chief complaint on file.   Davie A Mccauley is a 35 y.o. male.  HPI  35 year old male with past medical history of seizures presents the emergency department with lower back/buttocks pain.  Patient states he was going down the stairs this morning when his cat slipped between his feet and caused him to trip.  He fell down onto his bottom and slid down approximately 12 stairs.  Did not hit his head, no loss of consciousness.  He is complaining of lower back pain.  Denies any neck or upper back pain.  He has been ambulatory but with significant pain.  Denies any numbness or tingling of the lower extremities.  No saddle anesthesia.  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 No pertinent surgical history.     Family History  Problem Relation Age of Onset   Hypertension Mother     Social History   Tobacco Use   Smoking status: Every Day    Packs/day: 1.00    Pack years: 0.00    Types: Cigarettes   Smokeless tobacco: Never  Substance Use Topics   Alcohol use: No   Drug use: Not Currently    Types: Marijuana    Comment: 04/01/20 "when my body is in pain, maybe 2 x q/ 6 months"    Home Medications Prior to Admission medications   Medication Sig Start Date End Date Taking? Authorizing Provider  albuterol (PROVENTIL) (2.5 MG/3ML) 0.083% nebulizer solution Take 3 mLs (2.5 mg total) by  nebulization every 4 (four) hours as needed for wheezing or shortness of breath. Patient not taking: No sig reported 08/09/20 08/09/21  Rolla Etienne, NP  albuterol (VENTOLIN HFA) 108 (90 Base) MCG/ACT inhaler Inhale 1-2 puffs into the lungs every 6 (six) hours as needed for wheezing or shortness of breath. Patient not taking: Reported on 01/08/2021 04/26/19   Mickie Bail, NP  aspirin-acetaminophen-caffeine (EXCEDRIN EXTRA STRENGTH) [phone] MG tablet Take 1 tablet by mouth every 6 (six) hours as needed for headache. Patient not taking: No sig reported 03/28/20   Rema Fendt, NP  cyclobenzaprine (FLEXERIL) 10 MG tablet Take 1 tablet (10 mg total) by mouth 3 (three) times daily as needed for muscle spasms. 02/19/21   Kathryne Hitch, MD  divalproex (DEPAKOTE) 500 MG DR tablet Take 1 tablet (500 mg total) by mouth at bedtime. Patient not taking: No sig reported 03/14/20   Penumalli, Glenford Bayley, MD  hydrOXYzine (ATARAX/VISTARIL) 25 MG tablet Take 0.5-1 tablets (12.5-25 mg total) by mouth at bedtime as needed (insomnia). Patient not taking: No sig reported 02/15/20   Wallis Bamberg, PA-C  nabumetone (RELAFEN) 750 MG tablet Take 1 tablet (750 mg total) by mouth 2 (two) times daily as needed. 02/19/21   Kathryne Hitch, MD  naproxen (NAPROSYN) 500 MG tablet Take 1 tablet (500 mg total) by mouth 2 (  two) times daily. 01/08/21   Vanetta Mulders, MD  naproxen (NAPROSYN) 500 MG tablet Take 1 tablet (500 mg total) by mouth 2 (two) times daily. 01/08/21   Vanetta Mulders, MD    Allergies    Patient has no known allergies.  Review of Systems   Review of Systems  Constitutional:  Negative for chills and fever.  HENT:  Negative for congestion.   Eyes:  Negative for visual disturbance.  Respiratory:  Negative for shortness of breath.   Cardiovascular:  Negative for chest pain.  Gastrointestinal:  Negative for abdominal pain, diarrhea and vomiting.  Genitourinary:  Negative for difficulty urinating.   Musculoskeletal:  Positive for back pain. Negative for neck pain.  Skin:  Negative for rash.  Neurological:  Negative for weakness, numbness and headaches.   Physical Exam Updated Vital Signs BP (!) 148/109 (BP Location: Right Arm)   Pulse 80   Temp 98 F (36.7 C)   Resp 17   SpO2 95%   Physical Exam Vitals and nursing note reviewed.  Constitutional:      Appearance: Normal appearance.  HENT:     Head: Normocephalic.     Mouth/Throat:     Mouth: Mucous membranes are moist.  Cardiovascular:     Rate and Rhythm: Normal rate.  Pulmonary:     Effort: Pulmonary effort is normal. No respiratory distress.  Abdominal:     Palpations: Abdomen is soft.     Tenderness: There is no abdominal tenderness.  Musculoskeletal:     Cervical back: Rigidity present. No tenderness.     Comments: Tenderness to palpation of the midline lumbar spine, no step-offs, no overlying hematoma/bruising/skin changes, no tenderness to palpation of the neck or thoracic spine  Skin:    General: Skin is warm.  Neurological:     Mental Status: He is alert and oriented to person, place, and time. Mental status is at baseline.     Motor: No weakness.  Psychiatric:        Mood and Affect: Mood normal.    ED Results / Procedures / Treatments   Labs (all labs ordered are listed, but only abnormal results are displayed) Labs Reviewed - No data to display  EKG None  Radiology DG Lumbar Spine Complete  Result Date: 02/26/2021 CLINICAL DATA:  Pain following fall EXAM: LUMBAR SPINE - COMPLETE 4+ VIEW COMPARISON:  None. FINDINGS: Frontal, lateral, spot lumbosacral lateral, and bilateral oblique views were obtained. There are 5 non-rib-bearing lumbar type vertebral bodies. No fracture or spondylolisthesis. Disc spaces appear unremarkable. There is no appreciable facet arthropathy. Calcifications overlying each kidney, likely scattered renal calculi. IMPRESSION: No fracture or spondylolisthesis. No appreciable  arthropathic change. Suspected small calculi in each kidney. Electronically Signed   By: Bretta Bang III M.D.   On: 02/26/2021 08:42    Procedures Procedures   Medications Ordered in ED Medications  HYDROcodone-acetaminophen (NORCO/VICODIN) 5-325 MG per tablet 1 tablet (1 tablet Oral Given 02/26/21 1607)    ED Course  I have reviewed the triage vital signs and the nursing notes.  Pertinent labs & imaging results that were available during my care of the patient were reviewed by me and considered in my medical decision making (see chart for details).    MDM Rules/Calculators/A&P                          35 year old male presents the emergency department with lower back injury, after falling down onto his  bottom/back approximately 12 stairs.  No head injury.  He is neurovascularly intact.  Imaging shows no fracture.  We will plan to treat pain symptomatically and outpatient follow-up.  Patient will be discharged and treated as an outpatient.  Discharge plan and strict return to ED precautions discussed, patient verbalizes understanding and agreement.  Final Clinical Impression(s) / ED Diagnoses Final diagnoses:  None    Rx / DC Orders ED Discharge Orders     None        Rozelle Logan, DO 02/26/21 1123

## 2021-02-26 NOTE — Discharge Instructions (Addendum)
You have been seen and discharged from the emergency department.  Your imaging was negative for spine fracture.  Take Tylenol ibuprofen as needed for pain control.  Use stronger pain medicine as needed.  Do not mix this medication with alcohol or other sedating medications. Do not drive or do heavy physical activity and to know how this medication affects you.  It may cause drowsiness.  Avoid heavy lifting/twisting motions for the next day follow-up with your primary provider for reevaluation and further care. Take home medications as prescribed. If you have any worsening symptoms or further concerns for your health please return to an emergency department for further evaluation.

## 2021-02-26 NOTE — ED Triage Notes (Signed)
Pt here after falling down some steps this morning , c/o low back pain

## 2021-02-26 NOTE — ED Notes (Signed)
Ambulatory to lobby without difficulty. Educated on discharge instructions. Denies any needs or questions at this time

## 2021-03-10 ENCOUNTER — Encounter (HOSPITAL_COMMUNITY): Payer: Self-pay | Admitting: Emergency Medicine

## 2021-03-10 ENCOUNTER — Emergency Department (HOSPITAL_COMMUNITY)
Admission: EM | Admit: 2021-03-10 | Discharge: 2021-03-10 | Disposition: A | Discharge status: Home / Self Care | Payer: Medicaid Other | Attending: Emergency Medicine | Admitting: Emergency Medicine

## 2021-03-10 ENCOUNTER — Other Ambulatory Visit: Payer: Self-pay

## 2021-03-10 DIAGNOSIS — Z20822 Contact with and (suspected) exposure to covid-19: Secondary | ICD-10-CM | POA: Insufficient documentation

## 2021-03-10 DIAGNOSIS — R197 Diarrhea, unspecified: Secondary | ICD-10-CM | POA: Diagnosis not present

## 2021-03-10 DIAGNOSIS — Z28310 Unvaccinated for covid-19: Secondary | ICD-10-CM | POA: Diagnosis not present

## 2021-03-10 DIAGNOSIS — R519 Headache, unspecified: Secondary | ICD-10-CM | POA: Insufficient documentation

## 2021-03-10 DIAGNOSIS — R52 Pain, unspecified: Secondary | ICD-10-CM

## 2021-03-10 DIAGNOSIS — R112 Nausea with vomiting, unspecified: Secondary | ICD-10-CM | POA: Insufficient documentation

## 2021-03-10 DIAGNOSIS — F17210 Nicotine dependence, cigarettes, uncomplicated: Secondary | ICD-10-CM | POA: Diagnosis not present

## 2021-03-10 DIAGNOSIS — M791 Myalgia, unspecified site: Secondary | ICD-10-CM | POA: Insufficient documentation

## 2021-03-10 DIAGNOSIS — J45909 Unspecified asthma, uncomplicated: Secondary | ICD-10-CM | POA: Insufficient documentation

## 2021-03-10 LAB — SARS CORONAVIRUS 2 (TAT 6-24 HRS): SARS Coronavirus 2: NEGATIVE

## 2021-03-10 MED ORDER — ONDANSETRON 4 MG PO TBDP: 4.0000 mg | ORAL_TABLET | Freq: Three times a day (TID) | ORAL | 0 refills | Status: DC | PRN

## 2021-03-10 MED ORDER — NAPROXEN 375 MG PO TABS: 375.0000 mg | ORAL_TABLET | Freq: Two times a day (BID) | ORAL | 0 refills | Status: DC | PRN

## 2021-03-10 NOTE — ED Triage Notes (Signed)
Patient reports overall body pain starting yesterday. States yesterday was worse then today- feels very similar to yeas ago when he had a heat stroke. Works outside daily. Episode of emesis this am.

## 2021-03-10 NOTE — ED Provider Notes (Signed)
[facility] EMERGENCY DEPARTMENT Provider Note   CSN: [id number] Arrival date & time: 03/10/21  2841     History Chief Complaint  Patient presents with   Generalized Body Aches    Wesley Clarke is a 35 y.o. male.  HPI Patient is a 35 year old male with a medical history as noted below.  He presents to the emergency department today due to body aches.  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  Yesterday he began experiencing a headache, body aches, watery brown diarrhea, as well as intermittent nausea/vomiting.  He states his vomit was clear.  Denies any abdominal pain, chest pain, shortness of breath, urinary complaints, URI symptoms.  He has not been vaccinated for COVID-19 and denies a known history of COVID-19 infection.  He states that yesterday he hydrated and rested and that his symptoms have significantly improved but came to the emergency department today for evaluation as well as a work note.    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 No pertinent surgical history.     Family History  Problem Relation Age of Onset   Hypertension Mother     Social History   Tobacco Use   Smoking status: Every Day    Packs/day: 1.00    Pack years: 0.00    Types: Cigarettes   Smokeless tobacco: Never  Substance Use Topics   Alcohol use: No   Drug use: Not Currently    Types: Marijuana    Comment: 04/01/20 "when my body is  in pain, maybe 2 x q/ 6 months"    Home Medications Prior to Admission medications   Medication Sig Start Date End Date Taking? Authorizing Provider  naproxen (NAPROSYN) 375 MG tablet Take 1 tablet (375 mg total) by mouth 2 (two) times daily as needed. 03/10/21  Yes Placido Sou, PA-C  ondansetron (ZOFRAN ODT) 4 MG disintegrating tablet Take 1 tablet (4 mg total) by mouth every 8 (eight) hours as needed for nausea or vomiting. 03/10/21  Yes Placido Sou, PA-C  albuterol (PROVENTIL) (2.5 MG/3ML) 0.083% nebulizer solution Take 3 mLs (2.5 mg total) by nebulization every 4 (four) hours as needed for wheezing or shortness of breath. Patient not taking: No sig reported 08/09/20 08/09/21  Rolla Etienne, NP  albuterol (VENTOLIN HFA) 108 (90 Base) MCG/ACT inhaler Inhale 1-2 puffs into the lungs every 6 (six) hours as needed for wheezing or shortness of breath. Patient not taking: Reported on 01/08/2021 04/26/19   Mickie Bail, NP  aspirin-acetaminophen-caffeine (EXCEDRIN EXTRA STRENGTH) [phone] MG tablet Take 1 tablet by mouth every 6 (six) hours as needed for headache. Patient not taking: No sig reported 03/28/20   Rema Fendt, NP  cyclobenzaprine (FLEXERIL) 10 MG tablet Take 1 tablet (10 mg total) by mouth 3 (three) times daily as needed for muscle spasms. 02/19/21   Kathryne Hitch, MD  divalproex (DEPAKOTE) 500 MG  DR tablet Take 1 tablet (500 mg total) by mouth at bedtime. Patient not taking: No sig reported 03/14/20   Penumalli, Glenford Bayley, MD  hydrOXYzine (ATARAX/VISTARIL) 25 MG tablet Take 0.5-1 tablets (12.5-25 mg total) by mouth at bedtime as needed (insomnia). Patient not taking: No sig reported 02/15/20   Wallis Bamberg, PA-C  nabumetone (RELAFEN) 750 MG tablet Take 1 tablet (750 mg total) by mouth 2 (two) times daily as needed. 02/19/21   Kathryne Hitch, MD    Allergies    Patient has no known allergies.  Review of Systems   Review of Systems  All other systems reviewed  and are negative. Ten systems reviewed and are negative for acute change, except as noted in the HPI.   Physical Exam Updated Vital Signs BP (!) 143/83 (BP Location: Right Arm)   Pulse 85   Temp 98 F (36.7 C) (Oral)   Resp 12   Ht 5\' 11"  (1.803 m)   Wt 86.2 kg   SpO2 100%   BMI 26.50 kg/m   Physical Exam Vitals and nursing note reviewed.  Constitutional:      General: He is not in acute distress.    Appearance: Normal appearance. He is not ill-appearing, toxic-appearing or diaphoretic.  HENT:     Head: Normocephalic and atraumatic.     Right Ear: External ear normal.     Left Ear: External ear normal.     Nose: Nose normal.     Mouth/Throat:     Mouth: Mucous membranes are moist.     Pharynx: Oropharynx is clear. No oropharyngeal exudate or posterior oropharyngeal erythema.  Eyes:     General: No scleral icterus.       Right eye: No discharge.        Left eye: No discharge.     Extraocular Movements: Extraocular movements intact.     Conjunctiva/sclera: Conjunctivae normal.  Cardiovascular:     Rate and Rhythm: Normal rate and regular rhythm.     Pulses: Normal pulses.     Heart sounds: Normal heart sounds. No murmur heard.   No friction rub. No gallop.  Pulmonary:     Effort: Pulmonary effort is normal. No respiratory distress.     Breath sounds: Normal breath sounds. No stridor. No wheezing, rhonchi or rales.  Abdominal:     General: Abdomen is flat.     Palpations: Abdomen is soft.     Tenderness: There is no abdominal tenderness.     Comments: Abdomen is soft and nontender in all 4 quadrants.  Musculoskeletal:        General: Normal range of motion.     Cervical back: Normal range of motion and neck supple. No tenderness.  Skin:    General: Skin is warm and dry.  Neurological:     General: No focal deficit present.     Mental Status: He is alert and oriented to person, place, and time.  Psychiatric:        Mood and Affect: Mood normal.        Behavior:  Behavior normal.    ED Results / Procedures / Treatments   Labs (all labs ordered are listed, but only abnormal results are displayed) Labs Reviewed  SARS CORONAVIRUS 2 (TAT 6-24 HRS)    EKG None  Radiology No results found.  Procedures Procedures   Medications Ordered in ED Medications - No data to display  ED Course  I have reviewed the triage vital signs and the  nursing notes.  Pertinent labs & imaging results that were available during my care of the patient were reviewed by me and considered in my medical decision making (see chart for details).    MDM Rules/Calculators/A&P                          Patient is a 35 year old male who presents to the emergency department due to non-intractable nausea/vomiting, intermittent diarrhea, as well as body aches.  Symptoms started yesterday but he notes that they have improved significantly with oral hydration.  He came to the emergency department today for a work note as well as evaluation.  Physical exam is extremely reassuring.  Heart is regular rate and rhythm.  Lungs are clear to auscultation bilaterally.  Abdomen is soft and nontender.  Vital signs reassuring.  Patient offered lab work as well as IV fluids but he declined.  Requests a prescription for this body aches as well as nausea/vomiting.  We will provide him prescriptions for naproxen as well as Zofran.  He denies a history of kidney issues or stomach ulcers.  He has not been vaccinated for COVID-19 and denies a known history of COVID-19 infection so I will also obtain a COVID-19 test at this visit.  He is going to check these results on MyChart.  Feel the patient is stable for discharge at this time and he is agreeable.  Discussed return precautions.  His questions were answered and he was amicable at the time of discharge.  Final Clinical Impression(s) / ED Diagnoses Final diagnoses:  Body aches    Rx / DC Orders ED Discharge Orders          Ordered    naproxen  (NAPROSYN) 375 MG tablet  2 times daily PRN        03/10/21 0849    ondansetron (ZOFRAN ODT) 4 MG disintegrating tablet  Every 8 hours PRN        03/10/21 0849             Placido Sou, PA-C 03/10/21 4163    Vanetta Mulders, MD 03/11/21 [phone]

## 2021-03-10 NOTE — Discharge Instructions (Addendum)
Please check the results of your COVID-19 test on MyChart later today.  If you find your test is positive you will need to quarantine based on current CDC guidelines.  I am prescribing you an anti-inflammatory medication called naproxen.  This is similar to Aleve.  You can take this up to 2 times a day for management of your pain.  Try to take it with a small amount of food to help prevent stomach irritation.  I am prescribing you a medication called Zofran.  This is a disintegrating tablet you can use up to 3 times a day for management of your nausea and vomiting.  Please only take this as prescribed.  Please only take this if you are experiencing nausea and vomiting that you cannot control.  Please continue to monitor your symptoms closely.  If they worsen, please come back to the emergency department.  It was a pleasure to meet you.

## 2021-03-22 ENCOUNTER — Encounter (HOSPITAL_BASED_OUTPATIENT_CLINIC_OR_DEPARTMENT_OTHER): Payer: Self-pay | Admitting: Orthopaedic Surgery

## 2021-03-22 ENCOUNTER — Other Ambulatory Visit: Payer: Self-pay

## 2021-03-23 ENCOUNTER — Other Ambulatory Visit: Payer: Self-pay | Admitting: Physician Assistant

## 2021-03-28 ENCOUNTER — Ambulatory Visit (HOSPITAL_COMMUNITY): Payer: Medicaid Other | Admitting: Anesthesiology

## 2021-03-29 ENCOUNTER — Ambulatory Visit (HOSPITAL_BASED_OUTPATIENT_CLINIC_OR_DEPARTMENT_OTHER)
Admission: RE | Admit: 2021-03-29 | Discharge: 2021-03-29 | Disposition: A | Discharge status: Home / Self Care | Payer: Medicaid Other | Attending: Orthopaedic Surgery | Admitting: Orthopaedic Surgery

## 2021-03-29 ENCOUNTER — Encounter (HOSPITAL_BASED_OUTPATIENT_CLINIC_OR_DEPARTMENT_OTHER)
Admission: RE | Disposition: A | Discharge status: Home / Self Care | Payer: Self-pay | Source: Home / Self Care | Attending: Orthopaedic Surgery

## 2021-03-29 ENCOUNTER — Other Ambulatory Visit: Payer: Self-pay

## 2021-03-29 ENCOUNTER — Encounter (HOSPITAL_BASED_OUTPATIENT_CLINIC_OR_DEPARTMENT_OTHER): Payer: Self-pay | Admitting: Orthopaedic Surgery

## 2021-03-29 DIAGNOSIS — M75121 Complete rotator cuff tear or rupture of right shoulder, not specified as traumatic: Secondary | ICD-10-CM

## 2021-03-29 SURGERY — ARTHROSCOPY, SHOULDER, WITH ROTATOR CUFF REPAIR
Anesthesia: General | Site: Shoulder | Laterality: Right

## 2021-03-29 MED ORDER — LIDOCAINE HCL (PF) 2 % IJ SOLN
INTRAMUSCULAR | Status: AC
  Filled 2021-03-29: qty 5

## 2021-03-29 MED ORDER — CEFAZOLIN SODIUM-DEXTROSE 2-4 GM/100ML-% IV SOLN: 2.0000 g | INTRAVENOUS | Status: DC

## 2021-03-29 MED ORDER — DEXAMETHASONE SODIUM PHOSPHATE 10 MG/ML IJ SOLN
INTRAMUSCULAR | Status: AC
  Filled 2021-03-29: qty 1

## 2021-03-29 MED ORDER — FENTANYL CITRATE (PF) 100 MCG/2ML IJ SOLN
INTRAMUSCULAR | Status: AC
  Filled 2021-03-29: qty 2

## 2021-03-29 MED ORDER — LACTATED RINGERS IV SOLN: INTRAVENOUS | Status: DC

## 2021-03-29 MED ORDER — CEFAZOLIN SODIUM-DEXTROSE 2-4 GM/100ML-% IV SOLN
INTRAVENOUS | Status: AC
  Filled 2021-03-29: qty 100

## 2021-03-29 MED ORDER — PROPOFOL 10 MG/ML IV BOLUS
INTRAVENOUS | Status: AC
  Filled 2021-03-29: qty 20

## 2021-03-29 MED ORDER — ROCURONIUM BROMIDE 10 MG/ML (PF) SYRINGE
PREFILLED_SYRINGE | INTRAVENOUS | Status: AC
  Filled 2021-03-29: qty 10

## 2021-03-29 MED ORDER — ONDANSETRON HCL 4 MG/2ML IJ SOLN
INTRAMUSCULAR | Status: AC
  Filled 2021-03-29: qty 2

## 2021-03-29 NOTE — Anesthesia Preprocedure Evaluation (Deleted)
Anesthesia Evaluation  Patient identified by MRN, date of birth, ID band Patient awake    Reviewed: Allergy & Precautions, H&P , NPO status , Patient's Chart, lab work & pertinent test results, reviewed documented beta blocker date and time   Airway Mallampati: II  TM Distance: >3 FB Neck ROM: full    Dental no notable dental hx.    Pulmonary asthma , Current Smoker,    Pulmonary exam normal breath sounds clear to auscultation       Cardiovascular Exercise Tolerance: Good negative cardio ROS   Rhythm:regular Rate:Normal     Neuro/Psych Seizures -, Well Controlled,  PSYCHIATRIC DISORDERS Depression Bipolar Disorder    GI/Hepatic negative GI ROS, Neg liver ROS,   Endo/Other  negative endocrine ROS  Renal/GU negative Renal ROS  negative genitourinary   Musculoskeletal   Abdominal   Peds  Hematology negative hematology ROS (+)   Anesthesia Other Findings   Reproductive/Obstetrics negative OB ROS                             Anesthesia Physical Anesthesia Plan  ASA: 2  Anesthesia Plan: General   Post-op Pain Management: GA combined w/ Regional for post-op pain   Induction: Intravenous  PONV Risk Score and Plan: 2 and Ondansetron, Dexamethasone and Midazolam  Airway Management Planned: Oral ETT and LMA  Additional Equipment:   Intra-op Plan:   Post-operative Plan: Extubation in OR  Informed Consent: I have reviewed the patients History and Physical, chart, labs and discussed the procedure including the risks, benefits and alternatives for the proposed anesthesia with the patient or authorized representative who has indicated his/her understanding and acceptance.     Dental Advisory Given  Plan Discussed with: CRNA and Anesthesiologist  Anesthesia Plan Comments: (Discussed both nerve block for pain relief post-op and GA; including NV, sore throat, dental injury, and pulmonary  complications)        Anesthesia Quick Evaluation

## 2021-03-29 NOTE — Progress Notes (Signed)
Patient ID: Wesley Clarke, male   DOB: [birth year], 35 y.o.   MRN: [id number] The patient surgery has been canceled for today.  The patient was reluctant to have his airway examined by anesthesia and did not want to have an IV placed in his arm.  This is understandable given the patient's past mental health history as can be reviewed in chart.

## 2021-03-29 NOTE — H&P (Signed)
Wesley Clarke is an 35 y.o. male.   Chief Complaint: Right shoulder pain and weakness HPI: The patient is a 36 year old gentleman with right shoulder pain and weakness and a known rotator cuff tear that was diagnosed with clinical exam and MRI.  The tear likely occurred after a fall off of the ladder.  He has tried and failed all forms of conservative treatment.  At this point we recommended an arthroscopic intervention given the patient's failure of conservative treatment combined with his MRI findings of the right shoulder.  Past Medical History:  Diagnosis Date   ADHD (attention deficit hyperactivity disorder)    Asthma    Epileptic seizures (HCC)    last seizure 1996   Head injury 03/07/2020   fell off ladder   Impulse control disorder    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HCC)     Past Surgical History:  Procedure Laterality Date   NO PAST SURGERIES      Family History  Problem Relation Age of Onset   Hypertension Mother    Social History:  reports that he has been smoking cigarettes. He has been smoking an average of 1 pack per day. He has never used smokeless tobacco. He reports previous drug use. Drug: Marijuana. He reports that he does not drink alcohol.  Allergies: No Known Allergies  Medications Prior to Admission  Medication Sig Dispense Refill   cyclobenzaprine (FLEXERIL) 10 MG tablet Take 1 tablet (10 mg total) by mouth 3 (three) times daily as needed for muscle spasms. 30 tablet 0   nabumetone (RELAFEN) 750 MG tablet Take 1 tablet (750 mg total) by mouth 2 (two) times daily as needed. 60 tablet 1   albuterol (PROVENTIL) (2.5 MG/3ML) 0.083% nebulizer solution Take 3 mLs (2.5 mg total) by nebulization every 4 (four) hours as needed for wheezing or shortness of breath. (Patient not taking: No sig reported) 75 mL 1   albuterol (VENTOLIN HFA) 108 (90 Base) MCG/ACT inhaler Inhale 1-2 puffs into the lungs every 6 (six) hours as needed for wheezing or shortness of breath. (Patient not  taking: Reported on 01/08/2021) 18 g 0   aspirin-acetaminophen-caffeine (EXCEDRIN EXTRA STRENGTH) 250-250-65 MG tablet Take 1 tablet by mouth every 6 (six) hours as needed for headache. (Patient not taking: No sig reported) 30 tablet 0   divalproex (DEPAKOTE) 500 MG DR tablet Take 1 tablet (500 mg total) by mouth at bedtime. (Patient not taking: No sig reported) 30 tablet 0   hydrOXYzine (ATARAX/VISTARIL) 25 MG tablet Take 0.5-1 tablets (12.5-25 mg total) by mouth at bedtime as needed (insomnia). (Patient not taking: No sig reported) 30 tablet 0   naproxen (NAPROSYN) 375 MG tablet Take 1 tablet (375 mg total) by mouth 2 (two) times daily as needed. 20 tablet 0   ondansetron (ZOFRAN ODT) 4 MG disintegrating tablet Take 1 tablet (4 mg total) by mouth every 8 (eight) hours as needed for nausea or vomiting. 8 tablet 0    No results found for this or any previous visit (from the past 48 hour(s)). No results found.  Review of Systems  All other systems reviewed and are negative.  Height 5\' 11"  (1.803 m), weight 89.8 kg. Physical Exam Vitals reviewed.  Constitutional:      Appearance: Normal appearance.  HENT:     Head: Normocephalic and atraumatic.  Eyes:     Extraocular Movements: Extraocular movements intact.     Pupils: Pupils are equal, round, and reactive to light.  Cardiovascular:     Rate  and Rhythm: Normal rate and regular rhythm.  Pulmonary:     Effort: Pulmonary effort is normal.     Breath sounds: Normal breath sounds.  Abdominal:     Palpations: Abdomen is soft.  Musculoskeletal:     Right shoulder: Tenderness and bony tenderness present. Decreased range of motion. Decreased strength.     Cervical back: Normal range of motion and neck supple.  Neurological:     Mental Status: He is alert and oriented to person, place, and time.  Psychiatric:        Behavior: Behavior normal.     Assessment/Plan Right shoulder rotator cuff tear  Our plan is to proceed to surgery today  for right shoulder arthroscopy with debridement, subacromial decompression and repair of the rotator cuff as indicated.  The risks and benefits of surgery have been explained in detail and informed consent is obtained.    Kathryne Hitch, MD 03/29/2021, 7:19 AM

## 2021-04-02 ENCOUNTER — Telehealth: Payer: Self-pay | Admitting: Orthopaedic Surgery

## 2021-04-02 NOTE — Telephone Encounter (Signed)
Pt called stating he was supposed to have surgery but ended up cancelling due to past traumas and would like a CB to discuss any other options he may have that doesn't include surgery. Pt also wanted to know if he could live with the tear or would it continue to get worse? Pt also mentioned he's interested in PT.   [phone]

## 2021-04-02 NOTE — Telephone Encounter (Signed)
Please advise 

## 2021-04-03 NOTE — Telephone Encounter (Signed)
noted 

## 2021-04-05 ENCOUNTER — Encounter: Payer: Medicaid Other | Admitting: Physician Assistant

## 2021-04-09 ENCOUNTER — Other Ambulatory Visit: Payer: Self-pay

## 2021-04-09 ENCOUNTER — Encounter (HOSPITAL_COMMUNITY): Payer: Self-pay

## 2021-04-09 ENCOUNTER — Ambulatory Visit (HOSPITAL_COMMUNITY)
Admission: EM | Admit: 2021-04-09 | Discharge: 2021-04-09 | Disposition: A | Discharge status: Home / Self Care | Payer: Medicaid Other | Attending: Family Medicine | Admitting: Family Medicine

## 2021-04-09 DIAGNOSIS — R109 Unspecified abdominal pain: Secondary | ICD-10-CM | POA: Diagnosis present

## 2021-04-09 DIAGNOSIS — R112 Nausea with vomiting, unspecified: Secondary | ICD-10-CM | POA: Diagnosis present

## 2021-04-09 DIAGNOSIS — R059 Cough, unspecified: Secondary | ICD-10-CM | POA: Diagnosis not present

## 2021-04-09 DIAGNOSIS — R52 Pain, unspecified: Secondary | ICD-10-CM

## 2021-04-09 DIAGNOSIS — R5383 Other fatigue: Secondary | ICD-10-CM | POA: Diagnosis not present

## 2021-04-09 DIAGNOSIS — Z20822 Contact with and (suspected) exposure to covid-19: Secondary | ICD-10-CM | POA: Insufficient documentation

## 2021-04-09 DIAGNOSIS — J45909 Unspecified asthma, uncomplicated: Secondary | ICD-10-CM | POA: Diagnosis not present

## 2021-04-09 DIAGNOSIS — J069 Acute upper respiratory infection, unspecified: Secondary | ICD-10-CM | POA: Diagnosis not present

## 2021-04-09 DIAGNOSIS — F17210 Nicotine dependence, cigarettes, uncomplicated: Secondary | ICD-10-CM | POA: Diagnosis not present

## 2021-04-09 LAB — SARS CORONAVIRUS 2 (TAT 6-24 HRS): SARS Coronavirus 2: NEGATIVE

## 2021-04-09 MED ORDER — ONDANSETRON 4 MG PO TBDP: 4.0000 mg | ORAL_TABLET | Freq: Three times a day (TID) | ORAL | 0 refills | Status: DC | PRN

## 2021-04-09 NOTE — ED Triage Notes (Signed)
Pt in with c/o abdominal pain, congestion, and body aches   Pt also c/o nausea and vomiting   Pt states he took advil but vomited afterwards

## 2021-04-09 NOTE — ED Provider Notes (Signed)
[facility]    CSN: [id number] Arrival date & time: 04/09/21  0909      History   Chief Complaint Chief Complaint  Patient presents with   Abdominal Pain   Generalized Body Aches   Nasal Congestion    HPI Wesley Clarke is a 35 y.o. male.   Patient here today with 1 day history of body aches, fatigue, chills, congestion, cough, left upper quadrant abdominal pain, nausea, vomiting.  Denies chest pain, shortness of breath, bowel changes, known fever, intolerance to p.o.  Took 1 dose of Aleve last night but otherwise not trying anything over-the-counter for symptoms.  History of asthma and seasonal allergies, has albuterol nebulizer and inhaler at home but has not needed to use them since onset.  No known sick contacts recently.   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 Authorizing Provider  ondansetron (ZOFRAN ODT) 4 MG disintegrating tablet Take 1 tablet (4 mg total) by mouth every 8 (eight) hours as needed for nausea or vomiting. 04/09/21  Yes Particia Nearing, PA-C  albuterol (PROVENTIL) (2.5 MG/3ML) 0.083% nebulizer solution Take 3 mLs (2.5 mg total) by nebulization every 4 (four) hours as needed for wheezing or shortness of breath. Patient not taking: No sig reported  08/09/20 08/09/21  Rolla Etienne, NP  albuterol (VENTOLIN HFA) 108 (90 Base) MCG/ACT inhaler Inhale 1-2 puffs into the lungs every 6 (six) hours as needed for wheezing or shortness of breath. Patient not taking: Reported on 01/08/2021 04/26/19   Mickie Bail, NP  aspirin-acetaminophen-caffeine (EXCEDRIN EXTRA STRENGTH) [phone] MG tablet Take 1 tablet by mouth every 6 (six) hours as needed for headache. Patient not taking: No sig reported 03/28/20   Rema Fendt, NP  cyclobenzaprine (FLEXERIL) 10 MG tablet Take 1 tablet (10 mg total) by mouth 3 (three) times daily as needed for muscle spasms. 02/19/21   Kathryne Hitch, MD  divalproex (DEPAKOTE) 500 MG DR tablet Take 1 tablet (500 mg total) by mouth at bedtime. Patient not taking: No sig reported 03/14/20   Penumalli, Glenford Bayley, MD  hydrOXYzine (ATARAX/VISTARIL) 25 MG tablet Take 0.5-1 tablets (12.5-25 mg total) by mouth at bedtime as needed (insomnia). Patient not taking: No sig reported 02/15/20   Wallis Bamberg, PA-C  nabumetone (RELAFEN) 750 MG tablet Take 1 tablet (750 mg total) by mouth 2 (two) times daily as needed. 02/19/21   Kathryne Hitch, MD  naproxen (NAPROSYN) 375 MG tablet Take 1 tablet (375 mg total) by mouth 2 (two) times daily as needed. 03/10/21   Placido Sou, PA-C  ondansetron (ZOFRAN ODT) 4 MG disintegrating tablet Take 1 tablet (4 mg total) by mouth every 8 (eight) hours as needed  for nausea or vomiting. 03/10/21   Placido Sou, PA-C    Family History Family History  Problem Relation Age of Onset   Hypertension Mother     Social History Social History   Tobacco Use   Smoking status: Every Day    Packs/day: 1.00    Types: Cigarettes   Smokeless tobacco: Never  Substance Use Topics   Alcohol use: No   Drug use: Not Currently    Types: Marijuana    Comment: rare     Allergies   Patient has no known allergies.   Review of Systems Review of Systems Per HPI  Physical Exam Triage Vital  Signs ED Triage Vitals  Enc Vitals Group     BP 04/09/21 1040 (!) 146/97     Pulse Rate 04/09/21 1040 86     Resp 04/09/21 1040 18     Temp 04/09/21 1040 97.6 F (36.4 C)     Temp Source 04/09/21 1040 Oral     SpO2 04/09/21 1040 95 %     Weight --      Height --      Head Circumference --      Peak Flow --      Pain Score 04/09/21 1039 8     Pain Loc --      Pain Edu? --      Excl. in GC? --    No data found.  Updated Vital Signs BP (!) 146/97 (BP Location: Right Arm)   Pulse 86   Temp 97.6 F (36.4 C) (Oral)   Resp 18   SpO2 95%   Visual Acuity Right Eye Distance:   Left Eye Distance:   Bilateral Distance:    Right Eye Near:   Left Eye Near:    Bilateral Near:     Physical Exam Vitals and nursing note reviewed.  Constitutional:      Appearance: Normal appearance.  HENT:     Head: Atraumatic.     Right Ear: Tympanic membrane normal.     Left Ear: Tympanic membrane normal.     Nose: Rhinorrhea present.     Mouth/Throat:     Mouth: Mucous membranes are moist.     Pharynx: Posterior oropharyngeal erythema present. No oropharyngeal exudate.  Eyes:     Extraocular Movements: Extraocular movements intact.     Conjunctiva/sclera: Conjunctivae normal.  Cardiovascular:     Rate and Rhythm: Normal rate and regular rhythm.     Heart sounds: Normal heart sounds.  Pulmonary:     Effort: Pulmonary effort is normal.     Breath sounds: Normal breath sounds. No wheezing or rales.  Abdominal:     General: Bowel sounds are normal. There is no distension.     Palpations: Abdomen is soft.     Tenderness: There is abdominal tenderness. There is no right CVA tenderness, left CVA tenderness or guarding.     Comments: Mild epigastric and left upper quadrant tenderness palpation without guarding or distention  Musculoskeletal:        General: Normal range of motion.     Cervical back: Normal range of motion and neck supple.  Skin:    General: Skin is warm and dry.   Neurological:     General: No focal deficit present.     Mental Status: He is oriented to person, place, and time.     Motor: No weakness.     Gait: Gait normal.  Psychiatric:  Mood and Affect: Mood normal.        Thought Content: Thought content normal.        Judgment: Judgment normal.     UC Treatments / Results  Labs (all labs ordered are listed, but only abnormal results are displayed) Labs Reviewed  SARS CORONAVIRUS 2 (TAT 6-24 HRS)    EKG   Radiology No results found.  Procedures Procedures (including critical care time)  Medications Ordered in UC Medications - No data to display  Initial Impression / Assessment and Plan / UC Course  I have reviewed the triage vital signs and the nursing notes.  Pertinent labs & imaging results that were available during my care of the patient were reviewed by me and considered in my medical decision making (see chart for details).     Mildly hypertensive in triage but otherwise vital signs reassuring.Suspect COVID or COVID-like illness, COVID PCR pending, work note given with quarantine instructions.  We will send Zofran for relief of nausea and vomiting, discussed brat diet, push fluids, over-the-counter supportive medications and home care.  Return for acutely worsening symptoms.  Final Clinical Impressions(s) / UC Diagnoses   Final diagnoses:  Viral URI with cough  Non-intractable vomiting with nausea, unspecified vomiting type  Generalized body aches  Fatigue, unspecified type   Discharge Instructions   None    ED Prescriptions     Medication Sig Dispense Auth. Provider   ondansetron (ZOFRAN ODT) 4 MG disintegrating tablet Take 1 tablet (4 mg total) by mouth every 8 (eight) hours as needed for nausea or vomiting. 20 tablet Particia Nearing, [place]      PDMP not reviewed this encounter.   Particia Nearing, [place] 04/09/21 1126

## 2021-05-23 ENCOUNTER — Encounter (HOSPITAL_COMMUNITY): Payer: Self-pay | Admitting: Emergency Medicine

## 2021-05-23 ENCOUNTER — Other Ambulatory Visit: Payer: Self-pay

## 2021-05-23 ENCOUNTER — Ambulatory Visit (HOSPITAL_COMMUNITY)
Admission: EM | Admit: 2021-05-23 | Discharge: 2021-05-23 | Disposition: A | Discharge status: Home / Self Care | Payer: Medicaid Other | Attending: Emergency Medicine | Admitting: Emergency Medicine

## 2021-05-23 DIAGNOSIS — M5441 Lumbago with sciatica, right side: Secondary | ICD-10-CM

## 2021-05-23 DIAGNOSIS — T148XXA Other injury of unspecified body region, initial encounter: Secondary | ICD-10-CM | POA: Diagnosis not present

## 2021-05-23 DIAGNOSIS — M5442 Lumbago with sciatica, left side: Secondary | ICD-10-CM

## 2021-05-23 MED ORDER — METHYLPREDNISOLONE SODIUM SUCC 125 MG IJ SOLR
125.0000 mg | Freq: Once | INTRAMUSCULAR | Status: AC
  Administered 2021-05-23: 125 mg via INTRAMUSCULAR

## 2021-05-23 MED ORDER — KETOROLAC TROMETHAMINE 60 MG/2ML IM SOLN
60.0000 mg | Freq: Once | INTRAMUSCULAR | Status: AC
  Administered 2021-05-23: 60 mg via INTRAMUSCULAR

## 2021-05-23 MED ORDER — PREDNISONE 10 MG (21) PO TBPK: ORAL_TABLET | Freq: Every day | ORAL | 0 refills | Status: DC

## 2021-05-23 MED ORDER — KETOROLAC TROMETHAMINE 60 MG/2ML IM SOLN
INTRAMUSCULAR | Status: AC
  Filled 2021-05-23: qty 2

## 2021-05-23 MED ORDER — METHYLPREDNISOLONE SODIUM SUCC 125 MG IJ SOLR
INTRAMUSCULAR | Status: AC
  Filled 2021-05-23: qty 2

## 2021-05-23 MED ORDER — METHOCARBAMOL 500 MG PO TABS: 500.0000 mg | ORAL_TABLET | Freq: Two times a day (BID) | ORAL | 0 refills | Status: DC

## 2021-05-23 NOTE — Discharge Instructions (Addendum)
You will need to call ortho to follow up with further test ct or MRI Do not drive while taking robaxin take motrin  Use heat as needed for pain  Avoid lifting and pulling for 48 hours if possible.

## 2021-05-23 NOTE — ED Provider Notes (Signed)
[facility]    CSN: [id number] Arrival date & time: 05/23/21  0809      History   Chief Complaint Chief Complaint  Patient presents with   Numbness   Back Pain    HPI Wesley Clarke is a 35 y.o. male.   Pt was working 2 days ago and lifted a table. Began to have lower back and bil lower back pain with some tingling sensation down legs. States this am it was better but still has the pain. Has not taken anything pta.    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 Authorizing Provider  methocarbamol (ROBAXIN) 500 MG tablet Take 1 tablet (500 mg total) by mouth 2 (two) times daily. 05/23/21  Yes Coralyn Mark, NP  predniSONE (STERAPRED UNI-PAK 21 TAB) 10 MG (21) TBPK tablet Take by mouth daily. Take 6 tabs by mouth daily  for 2 days, then 5 tabs for 2 days, then 4 tabs for 2 days, then 3 tabs for 2 days, 2 tabs for 2 days, then 1 tab by mouth daily for 2 days 05/23/21  Yes Maple Mirza L, NP  albuterol (PROVENTIL) (2.5 MG/3ML) 0.083% nebulizer solution Take 3 mLs (2.5 mg total) by nebulization every 4 (four) hours as needed for wheezing or shortness of breath. Patient not taking: No sig reported 08/09/20 08/09/21  Rolla Etienne, NP  albuterol (VENTOLIN HFA) 108 (90 Base)  MCG/ACT inhaler Inhale 1-2 puffs into the lungs every 6 (six) hours as needed for wheezing or shortness of breath. Patient not taking: No sig reported 04/26/19   Mickie Bail, NP  aspirin-acetaminophen-caffeine (EXCEDRIN EXTRA STRENGTH) [phone] MG tablet Take 1 tablet by mouth every 6 (six) hours as needed for headache. Patient not taking: No sig reported 03/28/20   Rema Fendt, NP  cyclobenzaprine (FLEXERIL) 10 MG tablet Take 1 tablet (10 mg total) by mouth 3 (three) times daily as needed for muscle spasms. 02/19/21   Kathryne Hitch, MD  divalproex (DEPAKOTE) 500 MG DR tablet Take 1 tablet (500 mg total) by mouth at bedtime. Patient not taking: No sig reported 03/14/20   Penumalli, Glenford Bayley, MD  hydrOXYzine (ATARAX/VISTARIL) 25 MG tablet Take 0.5-1 tablets (12.5-25 mg total) by mouth at bedtime as needed (insomnia). Patient not taking: No sig reported 02/15/20   Wallis Bamberg, PA-C  nabumetone (RELAFEN) 750 MG tablet Take 1 tablet (750 mg total) by mouth 2 (two) times daily as needed. 02/19/21   Kathryne Hitch, MD  naproxen (NAPROSYN) 375 MG tablet Take 1 tablet (375 mg total) by mouth 2 (two) times daily as needed. 03/10/21   Placido Sou, PA-C  ondansetron (ZOFRAN ODT) 4 MG disintegrating tablet Take  1 tablet (4 mg total) by mouth every 8 (eight) hours as needed for nausea or vomiting. 03/10/21   Placido Sou, PA-C  ondansetron (ZOFRAN ODT) 4 MG disintegrating tablet Take 1 tablet (4 mg total) by mouth every 8 (eight) hours as needed for nausea or vomiting. 04/09/21   Particia Nearing, PA-C    Family History Family History  Problem Relation Age of Onset   Hypertension Mother     Social History Social History   Tobacco Use   Smoking status: Every Day    Packs/day: 1.00    Types: Cigarettes   Smokeless tobacco: Never  Substance Use Topics   Alcohol use: No   Drug use: Not Currently    Types: Marijuana    Comment: rare     Allergies   Patient has no  known allergies.   Review of Systems Review of Systems  Constitutional:  Negative for activity change.  Respiratory: Negative.    Cardiovascular: Negative.   Gastrointestinal: Negative.   Musculoskeletal:  Positive for back pain. Negative for gait problem, joint swelling, neck pain and neck stiffness.  Skin: Negative.   Neurological:        Tingling sensation to bil lower legs intermit     Physical Exam Triage Vital Signs ED Triage Vitals  Enc Vitals Group     BP 05/23/21 0838 (!) 151/103     Pulse Rate 05/23/21 0838 80     Resp 05/23/21 0838 18     Temp 05/23/21 0838 98.3 F (36.8 C)     Temp Source 05/23/21 0838 Oral     SpO2 05/23/21 0838 96 %     Weight --      Height --      Head Circumference --      Peak Flow --      Pain Score 05/23/21 0837 8     Pain Loc --      Pain Edu? --      Excl. in GC? --    No data found.  Updated Vital Signs BP (!) 151/103   Pulse 80   Temp 98.3 F (36.8 C) (Oral)   Resp 18   SpO2 96%   Visual Acuity Right Eye Distance:   Left Eye Distance:   Bilateral Distance:    Right Eye Near:   Left Eye Near:    Bilateral Near:     Physical Exam Constitutional:      Appearance: Normal appearance.  Cardiovascular:     Rate and Rhythm: Normal rate.  Pulmonary:     Effort: Pulmonary effort is normal.  Abdominal:     General: Abdomen is flat.  Musculoskeletal:        General: Tenderness present.     Cervical back: Normal range of motion.     Right lower leg: No edema.     Left lower leg: No edema.     Comments: Lower sacral pain with bil lower flank tenderness. Full ROM, able to walk and flex. No neuro change.   Skin:    General: Skin is warm.     Capillary Refill: Capillary refill takes less than 2 seconds.  Neurological:     General: No focal deficit present.     Mental Status: He is alert.     UC Treatments / Results  Labs (all labs ordered are listed, but only abnormal results are displayed) Labs Reviewed - No  data to display  EKG   Radiology No results found.  Procedures Procedures (including critical care time)  Medications Ordered in UC Medications  ketorolac (TORADOL) injection 60 mg (has no administration in time range)  methylPREDNISolone sodium succinate (SOLU-MEDROL) 125 mg/2 mL injection 125 mg (has no administration in time range)    Initial Impression / Assessment and Plan / UC Course  I have reviewed the triage vital signs and the nursing notes.  Pertinent labs & imaging results that were available during my care of the patient were reviewed by me and considered in my medical decision making (see chart for details).     You will need to call ortho to follow up with further test ct or MRI Do not drive while taking robaxin take motrin  Use heat as needed for pain  Avoid lifting and pulling for 48 hours if possible.  Final Clinical Impressions(s) / UC Diagnoses   Final diagnoses:  Acute bilateral low back pain with bilateral sciatica  Muscle strain     Discharge Instructions      You will need to call ortho to follow up with further test ct or MRI Do not drive while taking robaxin take motrin  Use heat as needed for pain  Avoid lifting and pulling for 48 hours if possible.      ED Prescriptions     Medication Sig Dispense Auth. Provider   predniSONE (STERAPRED UNI-PAK 21 TAB) 10 MG (21) TBPK tablet Take by mouth daily. Take 6 tabs by mouth daily  for 2 days, then 5 tabs for 2 days, then 4 tabs for 2 days, then 3 tabs for 2 days, 2 tabs for 2 days, then 1 tab by mouth daily for 2 days 42 tablet Maple Mirza L, NP   methocarbamol (ROBAXIN) 500 MG tablet Take 1 tablet (500 mg total) by mouth 2 (two) times daily. 20 tablet Coralyn Mark, NP      PDMP not reviewed this encounter.   Coralyn Mark, NP 05/23/21 [phone]

## 2021-05-23 NOTE — ED Triage Notes (Signed)
Pt is present today with lower back pain and numbness in both legs. Pt state that he noticed the pain and discomfort yesterday

## 2021-08-09 ENCOUNTER — Ambulatory Visit
Admission: EM | Admit: 2021-08-09 | Discharge: 2021-08-09 | Disposition: A | Discharge status: Home / Self Care | Payer: Medicaid Other | Attending: Internal Medicine | Admitting: Internal Medicine

## 2021-08-09 ENCOUNTER — Ambulatory Visit (INDEPENDENT_AMBULATORY_CARE_PROVIDER_SITE_OTHER): Payer: Medicaid Other

## 2021-08-09 DIAGNOSIS — M545 Low back pain, unspecified: Secondary | ICD-10-CM

## 2021-08-09 DIAGNOSIS — W19XXXA Unspecified fall, initial encounter: Secondary | ICD-10-CM | POA: Diagnosis not present

## 2021-08-09 MED ORDER — IBUPROFEN 600 MG PO TABS: 600.0000 mg | ORAL_TABLET | Freq: Four times a day (QID) | ORAL | 0 refills | Status: DC | PRN

## 2021-08-09 NOTE — ED Triage Notes (Signed)
Pt c/o fall that occurred today. Lower back sharp pain. States fell on hardwood. Denies LOC, hitting head.

## 2021-08-09 NOTE — ED Provider Notes (Signed)
[facility]    CSN: [id number] Arrival date & time: 08/09/21  4098      History   Chief Complaint Chief Complaint  Patient presents with   Fall    HPI Wesley Clarke is a 35 y.o. male.   Patient presents today for further evaluation of lower back pain that occurred today after a fall.  Patient reports that he was trying not to trip over his cat when he lost his balance and fell landing directly on his back.  Denies any head or losing consciousness during fall.  Denies any numbness or tingling.  Pain does not radiate.  Patient reports that it is difficult to sit due to pain.  Denies urinary burning, urinary frequency, urinary or bowel incontinence, saddle anesthesia.   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 Authorizing Provider  ibuprofen (ADVIL) 600 MG tablet Take 1 tablet (600 mg total) by mouth every 6 (six) hours as needed for mild pain or moderate pain. 08/09/21  Yes Timoty Bourke, Rolly Salter E, FNP  albuterol (PROVENTIL) (2.5 MG/3ML) 0.083% nebulizer solution Take 3 mLs (2.5 mg total) by nebulization every 4 (four) hours as needed for wheezing or shortness of breath. Patient not taking: No sig reported 08/09/20 08/09/21  Rolla Etienne, NP  albuterol (VENTOLIN HFA) 108 (90  Base) MCG/ACT inhaler Inhale 1-2 puffs into the lungs every 6 (six) hours as needed for wheezing or shortness of breath. Patient not taking: No sig reported 04/26/19   Mickie Bail, NP  aspirin-acetaminophen-caffeine (EXCEDRIN EXTRA STRENGTH) [phone] MG tablet Take 1 tablet by mouth every 6 (six) hours as needed for headache. Patient not taking: No sig reported 03/28/20   Rema Fendt, NP  cyclobenzaprine (FLEXERIL) 10 MG tablet Take 1 tablet (10 mg total) by mouth 3 (three) times daily as needed for muscle spasms. 02/19/21   Kathryne Hitch, MD  divalproex (DEPAKOTE) 500 MG DR tablet Take 1 tablet (500 mg total) by mouth at bedtime. Patient not taking: No sig reported 03/14/20   Penumalli, Glenford Bayley, MD  hydrOXYzine (ATARAX/VISTARIL) 25 MG tablet Take 0.5-1 tablets (12.5-25 mg total) by mouth at bedtime as needed (insomnia). Patient not taking: No sig reported 02/15/20   Wallis Bamberg, PA-C  methocarbamol (ROBAXIN) 500 MG tablet Take 1 tablet (500 mg total) by mouth 2 (two) times daily. 05/23/21   Coralyn Mark, NP  nabumetone (RELAFEN) 750 MG tablet Take 1 tablet (750 mg total) by mouth 2 (two) times daily as needed. 02/19/21   Kathryne Hitch, MD  naproxen (NAPROSYN) 375 MG tablet Take 1 tablet (375 mg total) by mouth 2 (two) times daily as needed. 03/10/21   Placido Sou,  PA-C  ondansetron (ZOFRAN ODT) 4 MG disintegrating tablet Take 1 tablet (4 mg total) by mouth every 8 (eight) hours as needed for nausea or vomiting. 03/10/21   Placido Sou, PA-C  ondansetron (ZOFRAN ODT) 4 MG disintegrating tablet Take 1 tablet (4 mg total) by mouth every 8 (eight) hours as needed for nausea or vomiting. 04/09/21   Particia Nearing, PA-C  predniSONE (STERAPRED UNI-PAK 21 TAB) 10 MG (21) TBPK tablet Take by mouth daily. Take 6 tabs by mouth daily  for 2 days, then 5 tabs for 2 days, then 4 tabs for 2 days, then 3 tabs for 2 days, 2 tabs for 2 days, then 1 tab by mouth daily for 2 days  05/23/21   Coralyn Mark, NP    Family History Family History  Problem Relation Age of Onset   Hypertension Mother     Social History Social History   Tobacco Use   Smoking status: Every Day    Packs/day: 1.00    Types: Cigarettes   Smokeless tobacco: Never  Substance Use Topics   Alcohol use: No   Drug use: Not Currently    Types: Marijuana    Comment: rare     Allergies   Patient has no known allergies.   Review of Systems Review of Systems Per HPI  Physical Exam Triage Vital Signs ED Triage Vitals [08/09/21 0943]  Enc Vitals Group     BP (!) 142/90     Pulse Rate 86     Resp 18     Temp 98 F (36.7 C)     Temp Source Oral     SpO2 97 %     Weight      Height      Head Circumference      Peak Flow      Pain Score 8     Pain Loc      Pain Edu?      Excl. in GC?    No data found.  Updated Vital Signs BP (!) 142/90 (BP Location: Right Arm)   Pulse 86   Temp 98 F (36.7 C) (Oral)   Resp 18   SpO2 97%   Visual Acuity Right Eye Distance:   Left Eye Distance:   Bilateral Distance:    Right Eye Near:   Left Eye Near:    Bilateral Near:     Physical Exam Constitutional:      General: He is not in acute distress.    Appearance: Normal appearance. He is not toxic-appearing or diaphoretic.  HENT:     Head: Normocephalic and atraumatic.  Eyes:     Extraocular Movements: Extraocular movements intact.     Conjunctiva/sclera: Conjunctivae normal.  Pulmonary:     Effort: Pulmonary effort is normal.  Musculoskeletal:     Cervical back: Normal.     Thoracic back: Normal.     Lumbar back: Tenderness and bony tenderness present. No swelling, edema or signs of trauma. Negative right straight leg raise test and negative left straight leg raise test.       Back:     Comments: Tenderness to palpation across the entirety of lower lumbar region.  Patient does have direct spinal tenderness but no crepitus or step-off.  Neurological:     General:  No focal deficit present.     Mental Status: He is alert and oriented to person, place, and time. Mental status is at baseline.     Deep Tendon Reflexes:  Reflexes are normal and symmetric.  Psychiatric:        Mood and Affect: Mood normal.        Behavior: Behavior normal.        Thought Content: Thought content normal.        Judgment: Judgment normal.     UC Treatments / Results  Labs (all labs ordered are listed, but only abnormal results are displayed) Labs Reviewed - No data to display  EKG   Radiology DG Lumbar Spine Complete  Result Date: 08/09/2021 CLINICAL DATA:  Low back pain after fall. EXAM: LUMBAR SPINE - COMPLETE 4+ VIEW COMPARISON:  February 26, 2021. FINDINGS: There is no evidence of lumbar spine fracture. Alignment is normal. Intervertebral disc spaces are maintained. IMPRESSION: Negative. Electronically Signed   By: Lupita Raider M.D.   On: 08/09/2021 11:00    Procedures Procedures (including critical care time)  Medications Ordered in UC Medications - No data to display  Initial Impression / Assessment and Plan / UC Course  I have reviewed the triage vital signs and the nursing notes.  Pertinent labs & imaging results that were available during my care of the patient were reviewed by me and considered in my medical decision making (see chart for details).     Lower lumbar x-ray was negative for any acute bony abnormality.  Suspect muscle strain or contusion.  Will treat with anti-inflammatories and ice application.  Ibuprofen sent as patient reports that he does not take any daily medicines including Relafen or naproxen.  Advised patient to avoid any other NSAIDs while taking ibuprofen.  No red flags on exam.  Discussed strict return precautions.  Patient verbalized understanding and was agreeable with plan. Final Clinical Impressions(s) / UC Diagnoses   Final diagnoses:  Fall, initial encounter  Acute bilateral low back pain without sciatica      Discharge Instructions      Your x-ray was normal.  You have been prescribed prescription strength ibuprofen to take as needed for pain.  Also use ice application to affected area.  Please do not take any additional over-the-counter or prescription NSAIDs in addition to the ibuprofen which include ibuprofen, Advil, Aleve, Relafen, naproxen.     ED Prescriptions     Medication Sig Dispense Auth. Provider   ibuprofen (ADVIL) 600 MG tablet Take 1 tablet (600 mg total) by mouth every 6 (six) hours as needed for mild pain or moderate pain. 30 tablet [place], Acie Fredrickson, [place]      PDMP not reviewed this encounter.   Gustavus Bryant, [place] 08/09/21 1115

## 2021-08-09 NOTE — Discharge Instructions (Signed)
Your x-ray was normal.  You have been prescribed prescription strength ibuprofen to take as needed for pain.  Also use ice application to affected area.  Please do not take any additional over-the-counter or prescription NSAIDs in addition to the ibuprofen which include ibuprofen, Advil, Aleve, Relafen, naproxen.

## 2021-08-10 ENCOUNTER — Encounter (HOSPITAL_COMMUNITY): Payer: Self-pay | Admitting: Emergency Medicine

## 2021-08-10 ENCOUNTER — Emergency Department (HOSPITAL_COMMUNITY)
Admission: EM | Admit: 2021-08-10 | Discharge: 2021-08-10 | Disposition: A | Discharge status: Home / Self Care | Payer: Medicaid Other | Attending: Emergency Medicine | Admitting: Emergency Medicine

## 2021-08-10 ENCOUNTER — Other Ambulatory Visit: Payer: Self-pay

## 2021-08-10 DIAGNOSIS — M545 Low back pain, unspecified: Secondary | ICD-10-CM | POA: Diagnosis present

## 2021-08-10 DIAGNOSIS — F17210 Nicotine dependence, cigarettes, uncomplicated: Secondary | ICD-10-CM | POA: Insufficient documentation

## 2021-08-10 MED ORDER — KETOROLAC TROMETHAMINE 30 MG/ML IJ SOLN
30.0000 mg | Freq: Once | INTRAMUSCULAR | Status: AC
  Administered 2021-08-10: 30 mg via INTRAMUSCULAR
  Filled 2021-08-10: qty 1

## 2021-08-10 MED ORDER — METHOCARBAMOL 500 MG PO TABS
500.0000 mg | ORAL_TABLET | Freq: Once | ORAL | Status: AC
  Administered 2021-08-10: 500 mg via ORAL
  Filled 2021-08-10: qty 1

## 2021-08-10 MED ORDER — METHOCARBAMOL 500 MG PO TABS: 500.0000 mg | ORAL_TABLET | Freq: Two times a day (BID) | ORAL | 0 refills | Status: DC

## 2021-08-10 NOTE — ED Provider Notes (Signed)
Emergency Medicine Provider Triage Evaluation Note  Novak A Ceniceros , a 35 y.o. male  was evaluated in triage.  Pt complains of lower back pain after tripping over cats. Patient was seen yesterday at [facility] with imaging. Negative work up. Patient here with ongoing pain. Patient states he had loss of sensation to feet 3x overnight. Currently patient denies loss of sensation to lower extremities. Patient has taken dose of ibuprofen this morning PTA.  Review of Systems  Positive: Lower back pain, numbness in feet Negative: Bowel/bladder dysfunction, groin numbness, lower extremity weakness  Physical Exam  BP (!) 151/106 (BP Location: Right Arm)   Pulse 83   Temp 97.9 F (36.6 C) (Oral)   Resp 16   SpO2 100%  Gen:   Awake, no distress   Resp:  Normal effort  MSK:   Moves extremities without difficulty  Other:  5/5 strength to lower extremities, sensation intact, diffuse lower back pain on palpation  Medical Decision Making  Medically screening exam initiated at 10:03 AM.  Appropriate orders placed.  Jenny A Heitzenrater was informed that the remainder of the evaluation will be completed by another provider, this initial triage assessment does not replace that evaluation, and the importance of remaining in the ED until their evaluation is complete.     Al Decant, PA-C 08/10/21 1005    Terald Sleeper, MD 08/10/21 1028

## 2021-08-10 NOTE — ED Provider Notes (Signed)
[facility] EM[facility] DEPARTMENT Provider Note  CSN: [id number] Arrival date & time: 08/10/21 1093    History Chief Complaint  Patient presents with   Back Pain    Wesley Clarke is a 35 y.o. male presents for evaluation of low back pain after a fall yesterday. Seen at UC, had neg xrays and given motrin without improvement. He reports while sitting on the cough and recliner last night his legs went numb three times, no pain radiating to legs. Numbness improved with getting up and walking around. Not currently having any numbness. No problems with bowel or bladder. Not on any blood thinners. No fevers.    Past Medical History:  Diagnosis Date   ADHD (attention deficit hyperactivity disorder)    Asthma    Epileptic seizures (HCC)    last seizure 1996   Head injury 03/07/2020   fell off ladder   Impulse control disorder    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HCC)     Past Surgical History:  Procedure Laterality Date   NO PAST SURGERIES      Family History  Problem Relation Age of Onset   Hypertension Mother     Social History   Tobacco Use   Smoking status: Every Day    Packs/day: 1.00    Types: Cigarettes   Smokeless tobacco: Never  Substance Use Topics   Alcohol use: No   Drug use: Not Currently    Types: Marijuana    Comment: rare     Home Medications Prior to Admission medications   Medication Sig Start Date End Date Taking? Authorizing Provider  divalproex (DEPAKOTE) 500 MG DR tablet Take 1 tablet (500 mg total) by mouth at bedtime. Patient not taking: No sig reported 03/14/20   Penumalli, Glenford Bayley, MD  ibuprofen (ADVIL) 600 MG tablet Take 1 tablet (600 mg total) by mouth every 6 (six) hours as needed for mild pain or moderate pain. 08/09/21   Gustavus Bryant, FNP  methocarbamol (ROBAXIN) 500 MG tablet Take 1 tablet (500 mg total) by mouth 2 (two) times daily. 08/10/21   Pollyann Savoy, MD     Allergies    Patient has no known allergies.   Review of Systems    Review of Systems A comprehensive review of systems was completed and negative except as noted in HPI.    Physical Exam BP (!) 151/106 (BP Location: Right Arm)   Pulse 83   Temp 97.9 F (36.6 C) (Oral)   Resp 16   SpO2 100%   Physical Exam Vitals and nursing note reviewed.  Constitutional:      Appearance: Normal appearance.  HENT:     Head: Normocephalic and atraumatic.     Nose: Nose normal.     Mouth/Throat:     Mouth: Mucous membranes are moist.  Eyes:     Extraocular Movements: Extraocular movements intact.     Conjunctiva/sclera: Conjunctivae normal.  Cardiovascular:     Rate and Rhythm: Normal rate.  Pulmonary:     Effort: Pulmonary effort is normal.     Breath sounds: Normal breath sounds.  Abdominal:     General: Abdomen is flat.     Palpations: Abdomen is soft.     Tenderness: There is no abdominal tenderness.  Musculoskeletal:        General: Tenderness (diffuse lumbar area,no focal bony tenderness) present. No swelling. Normal range of motion.     Cervical back: Neck supple.  Skin:    General: Skin is warm  and dry.  Neurological:     General: No focal deficit present.     Mental Status: He is alert.     Cranial Nerves: No cranial nerve deficit.     Sensory: No sensory deficit.     Motor: No weakness.     Coordination: Coordination normal.     Gait: Gait normal.     Deep Tendon Reflexes: Reflexes normal.  Psychiatric:        Mood and Affect: Mood normal.     ED Results / Procedures / Treatments   Labs (all labs ordered are listed, but only abnormal results are displayed) Labs Reviewed - No data to display  EKG None  Radiology DG Lumbar Spine Complete  Result Date: 08/09/2021 CLINICAL DATA:  Low back pain after fall. EXAM: LUMBAR SPINE - COMPLETE 4+ VIEW COMPARISON:  February 26, 2021. FINDINGS: There is no evidence of lumbar spine fracture. Alignment is normal. Intervertebral disc spaces are maintained. IMPRESSION: Negative. Electronically  Signed   By: Lupita Raider M.D.   On: 08/09/2021 11:00    Procedures Procedures  Medications Ordered in the ED Medications  ketorolac (TORADOL) 30 MG/ML injection 30 mg (has no administration in time range)  methocarbamol (ROBAXIN) tablet 500 mg (has no administration in time range)     MDM Rules/Calculators/A&P MDM Patient with continued low back pain, not improved with NSAIDs at home. Reports LE numbness last night but currently has normal neuro exam. No concern for acute cord compression. Will give Toradol IM, start robaxin, recommend outpatient PCP follow up for long term management.   ED Course  I have reviewed the triage vital signs and the nursing notes.  Pertinent labs & imaging results that were available during my care of the patient were reviewed by me and considered in my medical decision making (see chart for details).     Final Clinical Impression(s) / ED Diagnoses Final diagnoses:  Acute bilateral low back pain without sciatica    Rx / DC Orders ED Discharge Orders          Ordered    methocarbamol (ROBAXIN) 500 MG tablet  2 times daily        08/10/21 1049             Pollyann Savoy, MD 08/10/21 1049

## 2021-08-10 NOTE — ED Triage Notes (Signed)
Pt  here with low back pain after a fall , went to UC x ray negative , was told to come to ED if pain got worse

## 2022-01-09 ENCOUNTER — Other Ambulatory Visit: Payer: Self-pay

## 2022-01-09 ENCOUNTER — Emergency Department (HOSPITAL_COMMUNITY)
Admission: EM | Admit: 2022-01-09 | Discharge: 2022-01-09 | Disposition: A | Discharge status: Home / Self Care | Payer: Medicaid Other | Attending: Student | Admitting: Student

## 2022-01-09 ENCOUNTER — Emergency Department (HOSPITAL_COMMUNITY): Payer: Medicaid Other

## 2022-01-09 ENCOUNTER — Ambulatory Visit (HOSPITAL_COMMUNITY)
Admission: EM | Admit: 2022-01-09 | Discharge: 2022-01-09 | Disposition: A | Discharge status: Other Acute Inpatient Hospital | Payer: Medicaid Other

## 2022-01-09 ENCOUNTER — Encounter (HOSPITAL_COMMUNITY): Payer: Self-pay

## 2022-01-09 DIAGNOSIS — R1084 Generalized abdominal pain: Secondary | ICD-10-CM | POA: Diagnosis not present

## 2022-01-09 DIAGNOSIS — R1114 Bilious vomiting: Secondary | ICD-10-CM | POA: Diagnosis not present

## 2022-01-09 DIAGNOSIS — R109 Unspecified abdominal pain: Secondary | ICD-10-CM | POA: Diagnosis present

## 2022-01-09 DIAGNOSIS — N200 Calculus of kidney: Secondary | ICD-10-CM | POA: Insufficient documentation

## 2022-01-09 DIAGNOSIS — K529 Noninfective gastroenteritis and colitis, unspecified: Secondary | ICD-10-CM | POA: Insufficient documentation

## 2022-01-09 DIAGNOSIS — R030 Elevated blood-pressure reading, without diagnosis of hypertension: Secondary | ICD-10-CM | POA: Insufficient documentation

## 2022-01-09 LAB — CBC WITH DIFFERENTIAL/PLATELET
Abs Immature Granulocytes: 0.05 10*3/uL (ref 0.00–0.07)
Basophils Absolute: 0.1 10*3/uL (ref 0.0–0.1)
Basophils Relative: 1 %
Eosinophils Absolute: 0.2 10*3/uL (ref 0.0–0.5)
Eosinophils Relative: 3 %
HCT: 42.7 % (ref 39.0–52.0)
Hemoglobin: 14.7 g/dL (ref 13.0–17.0)
Immature Granulocytes: 1 %
Lymphocytes Relative: 35 %
Lymphs Abs: 3 10*3/uL (ref 0.7–4.0)
MCH: 29.7 pg (ref 26.0–34.0)
MCHC: 34.4 g/dL (ref 30.0–36.0)
MCV: 86.3 fL (ref 80.0–100.0)
Monocytes Absolute: 0.8 10*3/uL (ref 0.1–1.0)
Monocytes Relative: 10 %
Neutro Abs: 4.4 10*3/uL (ref 1.7–7.7)
Neutrophils Relative %: 50 %
Platelets: 213 10*3/uL (ref 150–400)
RBC: 4.95 MIL/uL (ref 4.22–5.81)
RDW: 12.6 % (ref 11.5–15.5)
WBC: 8.5 10*3/uL (ref 4.0–10.5)
nRBC: 0 % (ref 0.0–0.2)

## 2022-01-09 LAB — COMPREHENSIVE METABOLIC PANEL
ALT: 48 U/L — ABNORMAL HIGH (ref 0–44)
AST: 25 U/L (ref 15–41)
Albumin: 4 g/dL (ref 3.5–5.0)
Alkaline Phosphatase: 72 U/L (ref 38–126)
Anion gap: 3 — ABNORMAL LOW (ref 5–15)
BUN: 10 mg/dL (ref 6–20)
CO2: 28 mmol/L (ref 22–32)
Calcium: 9.8 mg/dL (ref 8.9–10.3)
Chloride: 110 mmol/L (ref 98–111)
Creatinine, Ser: 0.74 mg/dL (ref 0.61–1.24)
GFR, Estimated: >60 mL/min (ref >60)
Glucose, Bld: 90 mg/dL (ref 70–99)
Potassium: 4.2 mmol/L (ref 3.5–5.1)
Sodium: 141 mmol/L (ref 135–145)
Total Bilirubin: 0.6 mg/dL (ref 0.3–1.2)
Total Protein: 7.1 g/dL (ref 6.5–8.1)

## 2022-01-09 LAB — LIPASE, BLOOD: Lipase: 23 U/L (ref 11–51)

## 2022-01-09 NOTE — Discharge Instructions (Addendum)
As we discussed today your CT scan was overall reassuring.  You do have hepatic steatosis also called fatty liver.  Additionally you do have stones in your kidneys.  These do not cause symptoms when they are in your kidneys only when they pass.  Your blood pressure was elevated while in the emergency room.  This may be due to the stress of being in the emergency room however I would recommend following up with your doctor to get this rechecked in the next month. ?I have given you the information for the wellness clinic in case you do not have a primary care doctor. ?I offered you pain and nausea medicine including a prescription for nausea medicine at home which you refused. ?Please make sure you are drinking plenty water and staying well-hydrated. ? ?

## 2022-01-09 NOTE — ED Triage Notes (Signed)
Pt with periumbilical abdominal pain and n/v since Monday. Fever yesterday at home. Sent by urgent care d/t guarding of his abdomen.   ?

## 2022-01-09 NOTE — ED Notes (Signed)
Patient is being discharged from the Urgent Care and sent to the Emergency Department via POV . Per Phil Dopp, patient is in need of higher level of care due to Abdominal pain. Patient is aware and verbalizes understanding of plan of care.  ?Vitals:  ? 01/09/22 1302  ?BP: (!) 148/97  ?Pulse: 73  ?Resp: 14  ?Temp: 97.8 ?F (36.6 ?C)  ?SpO2: 96%  ?  ?

## 2022-01-09 NOTE — ED Triage Notes (Signed)
Pt presents with abdominal pain and emesis x 3 days.  ?

## 2022-01-09 NOTE — ED Provider Notes (Signed)
?[facility] ?Provider Note ? ? ?CSN: [id number] ?Arrival date & time: 01/09/22  1355 ? ?  ? ?History ? ?Chief Complaint  ?Patient presents with  ? Abdominal Pain  ? ? ?Wesley Clarke is a 36 y.o. male who presents today for evaluation of abdominal pain and vomiting.  He has vomited 10 times roughly over the past 2 days.  He denies diarrhea.  He reports that since this started his entire abdomen has been painful.  When the pain started it was diffused, it has not radiated or moved. ?He denies any fevers.  No urinary symptoms.  He was seen at urgent care and referred here for a CT scan additionally on notes at urgent care he was noted to have tenderness in both CVAs. ?When I asked patient about this he states "it is because I was hunched over vomiting." ?Additionally patient refuses IV.  He declines currently needing pain or nausea medications. ? ?HPI ? ?  ? ?Home Medications ?Prior to Admission medications   ?Medication Sig Start Date End Date Taking? Authorizing Provider  ?divalproex (DEPAKOTE) 500 MG DR tablet Take 1 tablet (500 mg total) by mouth at bedtime. ?Patient not taking: No sig reported 03/14/20   Penumalli, Earlean Polka, MD  ?ibuprofen (ADVIL) 600 MG tablet Take 1 tablet (600 mg total) by mouth every 6 (six) hours as needed for mild pain or moderate pain. ?Patient not taking: Reported on 01/09/2022 08/09/21   Teodora Medici, FNP  ?methocarbamol (ROBAXIN) 500 MG tablet Take 1 tablet (500 mg total) by mouth 2 (two) times daily. ?Patient not taking: Reported on 01/09/2022 08/10/21   Truddie Hidden, MD  ?   ? ?Allergies    ?Patient has no known allergies.   ? ?Review of Systems   ?Review of Systems ? ?Physical Exam ?Updated Vital Signs ?BP (!) 140/101   Pulse 74   Temp 97.9 ?F (36.6 ?C) (Oral)   Resp 14   Ht 5\' 11"  (1.803 m)   Wt 90.7 kg   SpO2 98%   BMI 27.89 kg/m?  ?Physical Exam ?Vitals and nursing note reviewed.  ?Constitutional:   ?   General: He is not in acute  distress. ?   Appearance: He is not ill-appearing.  ?HENT:  ?   Head: Atraumatic.  ?Eyes:  ?   Conjunctiva/sclera: Conjunctivae normal.  ?Cardiovascular:  ?   Rate and Rhythm: Normal rate.  ?Pulmonary:  ?   Effort: Pulmonary effort is normal. No respiratory distress.  ?Abdominal:  ?   General: Bowel sounds are decreased. There is no distension.  ?   Palpations: Abdomen is soft.  ?   Tenderness: There is generalized abdominal tenderness. There is no right CVA tenderness or left CVA tenderness.  ?Musculoskeletal:  ?   Cervical back: Normal range of motion and neck supple.  ?   Comments: No obvious acute injury  ?Skin: ?   General: Skin is warm.  ?Neurological:  ?   Mental Status: He is alert.  ?   Comments: Awake and alert, answers all questions appropriately.  Speech is not slurred.  ?Psychiatric:  ?   Comments: Patient expresses irritation at being here for three hours.   ? ? ?ED Results / Procedures / Treatments   ?Labs ?(all labs ordered are listed, but only abnormal results are displayed) ?Labs Reviewed  ?COMPREHENSIVE METABOLIC PANEL - Abnormal; Notable for the following components:  ?    Result Value  ? ALT 48 (*)   ?  Anion gap 3 (*)   ? All other components within normal limits  ?LIPASE, BLOOD  ?CBC WITH DIFFERENTIAL/PLATELET  ?URINALYSIS, ROUTINE W REFLEX MICROSCOPIC  ? ? ?EKG ?None ? ?Radiology ?CT ABDOMEN PELVIS WO CONTRAST ? ?Result Date: 01/09/2022 ?CLINICAL DATA:  Abdominal pain, acute, nonlocalized. EXAM: CT ABDOMEN AND PELVIS WITHOUT CONTRAST TECHNIQUE: Multidetector CT imaging of the abdomen and pelvis was performed following the standard protocol without IV contrast. RADIATION DOSE REDUCTION: This exam was performed according to the departmental dose-optimization program which includes automated exposure control, adjustment of the mA and/or kV according to patient size and/or use of iterative reconstruction technique. COMPARISON:  01/14/2004 FINDINGS: Lower chest: Left basilar rounded atelectasis.  Visualized right lung base is clear. Visualized heart and pericardium are unremarkable. Hepatobiliary: Moderate hepatic steatosis with areas of fatty sparing within the gallbladder fossa and segment 6. No definite intrahepatic mass on this noncontrast examination. No intra or extrahepatic biliary ductal dilation. Gallbladder unremarkable. Pancreas: Unremarkable Spleen: Unremarkable Adrenals/Urinary Tract: The adrenal glands are unremarkable. The kidneys are normal in size and position. Punctate nonobstructing calculi are seen within the lower pole of the right kidney. Nonobstructing calculus within the lower pole the left kidney measures 6 mm in dimension. No perinephric fluid collections. No hydronephrosis. No ureteral calculi. The bladder is unremarkable. Stomach/Bowel: The sigmoid colon is redundant. The stomach, small bowel, and large bowel are otherwise unremarkable. Appendix normal. No free intraperitoneal gas or fluid. Vascular/Lymphatic: No significant vascular findings are present. No enlarged abdominal or pelvic lymph nodes. Reproductive: Prostate is unremarkable. Other: Small fat containing right inguinal hernia. Rectum unremarkable. Musculoskeletal: No acute bone abnormality. No lytic or blastic bone lesion. IMPRESSION: Mild bilateral nonobstructing nephrolithiasis. No hydronephrosis. No urolithiasis. Hepatic steatosis. Electronically Signed   By: Fidela Salisbury M.D.   On: 01/09/2022 19:06   ? ?Procedures ?Procedures  ? ? ?Medications Ordered in ED ?Medications - No data to display ? ?ED Course/ Medical Decision Making/ A&P ?Clinical Course as of 01/09/22 2247  ?Wed Jan 09, 2022  ?1920 Patient is reevaluated.  I discussed his results with him including incidental findings.  At this point he is refusing any antiemetics either here or prescription for at home.  He is refusing p.o. challenge stating he just wishes to be discharged. ?I stressed the importance of maintaining adequate p.o. hydration and he  states his understanding. [EH]  ?2241 Comprehensive metabolic panel(!) ?ALT is minimally elevated however no acute clinically significant abnormalities. [EH]  ?2242 CBC with Differential ?Leukocytosis or anemia [EH]  ?2242 Lipase, blood ?Lipase is not significantly elevated. [EH]  ?[place] ?Nephrolithiasis and hepatosteatosis [EH]  ?  ?Clinical Course User Index ?[EH] Lorin Glass, PA-C  ? ?                        ?Medical Decision Making ?Patient is a 36 year old man who presents today from urgent care for evaluation for a CT scan of his abdomen.  He has had tenderness with episodes of vomiting over the past 2 days and as generalized abdominal tenderness. ?Urgent care noted that he had CVA tenderness, on my exam he does have mild tenderness to palpation over the CVA area but he does not have CVA tenderness to percussion, and without urinary symptoms I do not think this represents a renal pathology. ?I discussed CT scan with patient.  He adamantly refuses to have any IV access.  We discussed purpose of obtaining contrast for a  CT scan and he continued to refuse this.  His CT scan is changed to without contrast. ? ?CT scan is reviewed. ? ?Patient and I discussed his multiple incidental findings. ?At the time of discharge he refused to attempt a p.o. challenge.  He refused any antiemetics.  He refused a UA. ? ?I suspect a viral GI illness causing his symptoms.  ? ?Recommended conservative care, PCP follow-up. ? ?Return precautions were discussed with patient who states their understanding.  At the time of discharge patient denied any unaddressed complaints or concerns.  Patient is agreeable for discharge home. ? ?Note: Portions of this report may have been transcribed using voice recognition software. Every effort was made to ensure accuracy; however, inadvertent computerized transcription errors may be present ? ? ? ?Problems Addressed: ?Elevated blood pressure reading: undiagnosed  new problem with uncertain prognosis ?Gastroenteritis: acute illness or injury ?Generalized abdominal pain: acute illness or injury ?Nephrolithiasis: chronic illness or injury ? ?Amount and/or Complexity of Data

## 2022-01-09 NOTE — ED Notes (Signed)
Pt refuse to give urine sample, pt has label specimen cup ?  ?

## 2022-01-09 NOTE — Discharge Instructions (Signed)
-   Please go to the emergency room for further evaluation and management of your severe abdominal pain and vomiting ?

## 2022-01-09 NOTE — ED Provider Notes (Signed)
?[facility] ? ? ? ?CSN: [id number] ?Arrival date & time: 01/09/22  1234 ? ? ?  ? ?History   ?Chief Complaint ?Chief Complaint  ?Patient presents with  ? Abdominal Pain  ? Emesis  ? ? ?HPI ?Wesley Clarke is a 36 y.o. male.  ? ?Patient presents with abdominal pain ongoing for the past couple of days.  Reports the pain onset is constant and he rates the pain as an 8/10.   Describes the pain as discomfort. The patient denies radiation of pain elsewhere. The patient denies fever, weight loss, diarrhea, constipation, blood in stool, heartburn, rash, dysuria/urinary frequency, hematuria, and frequent NSAID use. The patient endorses nausea/vomiting and decreased appetite. Has not tried anything for his symptoms.  Certain positions and eating make the pain worse. Reports he vomits anytime he eats anything.  Denies known sick contacts.  ? ? ? ?Past Medical History:  ?Diagnosis Date  ? ADHD (attention deficit hyperactivity disorder)   ? Asthma   ? Epileptic seizures ([place])   ? last seizure 1996  ? Head injury 03/07/2020  ? fell off ladder  ? Impulse control disorder   ? 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[place])   ? ? ?Patient Active Problem List  ? Diagnosis Date Noted  ? Complete tear of right rotator cuff 03/29/2021  ? TOBACCO USER 05/20/2009  ? 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 ATTENTION DEFICIT, W/HYPERACTIVITY 10/30/2006  ? CONVULSIONS, SEIZURES, NOS 10/30/2006  ? ? ?Past Surgical History:  ?Procedure Laterality Date  ? NO PAST SURGERIES    ? ? ? ? ? ?Home Medications   ? ?Prior to Admission medications   ?Medication Sig Start Date End Date Taking? Authorizing Provider  ?divalproex (DEPAKOTE) 500 MG DR tablet Take 1 tablet (500 mg total) by mouth at bedtime. ?Patient not taking: No sig reported 03/14/20   Penumalli, Earlean Polka, MD  ?ibuprofen (ADVIL) 600 MG tablet Take 1 tablet (600 mg total) by mouth every 6 (six) hours as needed for mild pain or moderate pain. ?Patient not  taking: Reported on 01/09/2022 08/09/21   Teodora Medici, FNP  ?methocarbamol (ROBAXIN) 500 MG tablet Take 1 tablet (500 mg total) by mouth 2 (two) times daily. ?Patient not taking: Reported on 01/09/2022 08/10/21   Truddie Hidden, MD  ? ? ?Family History ?Family History  ?Problem Relation Age of Onset  ? Hypertension Mother   ? ? ?Social History ?Social History  ? ?Tobacco Use  ? Smoking status: Every Day  ?  Packs/day: 1.00  ?  Types: Cigarettes  ? Smokeless tobacco: Never  ?Substance Use Topics  ? Alcohol use: No  ? Drug use: Not Currently  ?  Types: Marijuana  ?  Comment: rare  ? ? ? ?Allergies   ?Patient has no known allergies. ? ? ?Review of Systems ?Review of Systems ?Per HPI ? ?Physical Exam ?Triage Vital Signs ?ED Triage Vitals [01/09/22 1302]  ?Enc Vitals Group  ?   BP (!) 148/97  ?   Pulse Rate 73  ?   Resp 14  ?   Temp 97.8 ?F (36.6 ?C)  ?   Temp src   ?   SpO2 96 %  ?   Weight   ?   Height   ?   Head Circumference   ?   Peak Flow   ?   Pain Score 8  ?   Pain Loc   ?   Pain Edu?   ?  Excl. in [place]?   ? ?No data found. ? ?Updated Vital Signs ?BP (!) 148/97 (BP Location: Right Arm)   Pulse 73   Temp 97.8 ?F (36.6 ?C)   Resp 14   SpO2 96%  ? ?Visual Acuity ?Right Eye Distance:   ?Left Eye Distance:   ?Bilateral Distance:   ? ?Right Eye Near:   ?Left Eye Near:    ?Bilateral Near:    ? ?Physical Exam ?Vitals and nursing note reviewed.  ?Constitutional:   ?   General: He is not in acute distress. ?   Appearance: He is well-developed. He is not toxic-appearing.  ?HENT:  ?   Head: Normocephalic and atraumatic.  ?Eyes:  ?   General: No scleral icterus. ?Cardiovascular:  ?   Rate and Rhythm: Normal rate and regular rhythm.  ?Pulmonary:  ?   Effort: Pulmonary effort is normal. No respiratory distress.  ?   Breath sounds: Normal breath sounds. No wheezing, rhonchi or rales.  ?Abdominal:  ?   General: Abdomen is flat.  ?   Palpations: Abdomen is soft.  ?   Tenderness: There is abdominal tenderness in the right  upper quadrant, right lower quadrant, left upper quadrant and left lower quadrant. There is right CVA tenderness, left CVA tenderness and guarding.  ?Skin: ?   General: Skin is warm and dry.  ?   Capillary Refill: Capillary refill takes less than 2 seconds.  ?   Coloration: Skin is not cyanotic, jaundiced or pale.  ?   Findings: No rash.  ?Neurological:  ?   Mental Status: He is alert and oriented to person, place, and time.  ?Psychiatric:     ?   Behavior: Behavior is cooperative.  ? ? ? ?UC Treatments / Results  ?Labs ?(all labs ordered are listed, but only abnormal results are displayed) ?Labs Reviewed - No data to display ? ?EKG ? ? ?Radiology ?No results found. ? ?Procedures ?Procedures (including critical care time) ? ?Medications Ordered in UC ?Medications - No data to display ? ?Initial Impression / Assessment and Plan / UC Course  ?I have reviewed the triage vital signs and the nursing notes. ? ?Pertinent labs & imaging results that were available during my care of the patient were reviewed by me and considered in my medical decision making (see chart for details). ? ?  ?Given exquisite tenderness on examination and guarding with CVA tenderness, recommended further work up and evaluation of abdominal pain in Emergency Room for further testing.  The patient was given the opportunity to ask questions.  All questions answered to their satisfaction.  The patient is in agreement to this plan.  ?  ?Final Clinical Impressions(s) / UC Diagnoses  ? ?Final diagnoses:  ?Generalized abdominal pain  ?Bilious vomiting with nausea  ? ? ? ?Discharge Instructions   ? ?  ?- Please go to the emergency room for further evaluation and management of your severe abdominal pain and vomiting ? ? ? ?ED Prescriptions   ?None ?  ? ?PDMP not reviewed this encounter. ?  ?Eulogio Bear, NP ?01/09/22 1338 ? ?

## 2022-01-09 NOTE — ED Provider Triage Note (Signed)
Emergency Medicine Provider Triage Evaluation Note ? ?Wesley Clarke , a 36 y.o. male  was evaluated in triage.  Pt complains of abdominal pain since Sunday evening.  Worsened yesterday.  Endorses emesis.  Denies diarrhea or constipation.  States he had a fever yesterday.  He was evaluated at urgent care referred to the emergency room.  Denies dysuria. ? ?Review of Systems  ?Positive: As above ?Negative: As above ? ?Physical Exam  ?BP (!) 147/105 (BP Location: Right Arm)   Pulse 72   Temp 98.5 ?F (36.9 ?C)   Resp 15   SpO2 100%  ?Gen:   Awake, no distress   ?Resp:  Normal effort  ?MSK:   Moves extremities without difficulty  ?Other:  Generalized abdominal tenderness with guarding ? ?Medical Decision Making  ?Medically screening exam initiated at 2:44 PM.  Appropriate orders placed.  Wesley Clarke was informed that the remainder of the evaluation will be completed by another provider, this initial triage assessment does not replace that evaluation, and the importance of remaining in the ED until their evaluation is complete. ? ? ?  ?W[place]ley [place], PA-C ?01/09/22 1445 ? ?

## 2022-01-09 NOTE — ED Notes (Signed)
Patient sleeping, no signs of distress. Side rails up. Bed in low position. Encouraged to provide urine specimen.  ?

## 2022-02-27 ENCOUNTER — Ambulatory Visit (HOSPITAL_COMMUNITY)
Admission: EM | Admit: 2022-02-27 | Discharge: 2022-02-27 | Disposition: A | Discharge status: Home / Self Care | Payer: Medicaid Other | Attending: Family Medicine | Admitting: Family Medicine

## 2022-02-27 ENCOUNTER — Encounter (HOSPITAL_COMMUNITY): Payer: Self-pay

## 2022-02-27 DIAGNOSIS — R110 Nausea: Secondary | ICD-10-CM | POA: Insufficient documentation

## 2022-02-27 DIAGNOSIS — R52 Pain, unspecified: Secondary | ICD-10-CM | POA: Diagnosis not present

## 2022-02-27 LAB — COMPREHENSIVE METABOLIC PANEL
ALT: 56 U/L — ABNORMAL HIGH (ref 0–44)
AST: 30 U/L (ref 15–41)
Albumin: 4.6 g/dL (ref 3.5–5.0)
Alkaline Phosphatase: 80 U/L (ref 38–126)
Anion gap: 9 (ref 5–15)
BUN: 12 mg/dL (ref 6–20)
CO2: 27 mmol/L (ref 22–32)
Calcium: 10.2 mg/dL (ref 8.9–10.3)
Chloride: 105 mmol/L (ref 98–111)
Creatinine, Ser: 0.88 mg/dL (ref 0.61–1.24)
GFR, Estimated: >60 mL/min (ref >60)
Glucose, Bld: 106 mg/dL — ABNORMAL HIGH (ref 70–99)
Potassium: 4.2 mmol/L (ref 3.5–5.1)
Sodium: 141 mmol/L (ref 135–145)
Total Bilirubin: 0.8 mg/dL (ref 0.3–1.2)
Total Protein: 7.9 g/dL (ref 6.5–8.1)

## 2022-02-27 LAB — CBC WITH DIFFERENTIAL/PLATELET
Abs Immature Granulocytes: 0.03 10*3/uL (ref 0.00–0.07)
Basophils Absolute: 0.1 10*3/uL (ref 0.0–0.1)
Basophils Relative: 1 %
Eosinophils Absolute: 0.3 10*3/uL (ref 0.0–0.5)
Eosinophils Relative: 4 %
HCT: 46.9 % (ref 39.0–52.0)
Hemoglobin: 15.9 g/dL (ref 13.0–17.0)
Immature Granulocytes: 0 %
Lymphocytes Relative: 37 %
Lymphs Abs: 3 10*3/uL (ref 0.7–4.0)
MCH: 29.4 pg (ref 26.0–34.0)
MCHC: 33.9 g/dL (ref 30.0–36.0)
MCV: 86.7 fL (ref 80.0–100.0)
Monocytes Absolute: 0.7 10*3/uL (ref 0.1–1.0)
Monocytes Relative: 9 %
Neutro Abs: 3.9 10*3/uL (ref 1.7–7.7)
Neutrophils Relative %: 49 %
Platelets: 254 10*3/uL (ref 150–400)
RBC: 5.41 MIL/uL (ref 4.22–5.81)
RDW: 12.7 % (ref 11.5–15.5)
WBC: 8 10*3/uL (ref 4.0–10.5)
nRBC: 0 % (ref 0.0–0.2)

## 2022-02-27 LAB — TSH: TSH: 1.02 u[IU]/mL (ref 0.350–4.500)

## 2022-02-27 LAB — CK: Total CK: 260 U/L (ref 49–397)

## 2022-02-27 MED ORDER — ONDANSETRON 4 MG PO TBDP
ORAL_TABLET | ORAL | Status: AC
  Filled 2022-02-27: qty 1

## 2022-02-27 MED ORDER — ONDANSETRON 4 MG PO TBDP: 4.0000 mg | ORAL_TABLET | Freq: Three times a day (TID) | ORAL | 0 refills | Status: DC | PRN

## 2022-02-27 MED ORDER — KETOROLAC TROMETHAMINE 60 MG/2ML IM SOLN
INTRAMUSCULAR | Status: AC
  Filled 2022-02-27: qty 2

## 2022-02-27 MED ORDER — ONDANSETRON HCL 4 MG/2ML IJ SOLN
4.0000 mg | Freq: Once | INTRAMUSCULAR | Status: AC
  Administered 2022-02-27: 4 mg via INTRAMUSCULAR

## 2022-02-27 MED ORDER — KETOROLAC TROMETHAMINE 60 MG/2ML IM SOLN
60.0000 mg | Freq: Once | INTRAMUSCULAR | Status: AC
  Administered 2022-02-27: 60 mg via INTRAMUSCULAR

## 2022-02-27 NOTE — Discharge Instructions (Addendum)
Please do your best to ensure adequate fluid intake in order to avoid dehydration. If you find that you are unable to tolerate drinking fluids regularly please proceed to the Emergency Department for evaluation.  You have had labs (blood work) drawn today. We will call you with any significant abnormalities or if there is need to begin or change treatment or pursue further follow up.  You may also review your test results online through MyChart. If you do not have a MyChart account, instructions to sign up should be on your discharge paperwork.

## 2022-02-27 NOTE — ED Triage Notes (Signed)
Pt states woke up feeling like crap and his body feels like its on fire. States works outside with his new job and has sunburn to his neck. States was very sweaty this morning. Denies taking any meds today.

## 2022-02-27 NOTE — ED Provider Notes (Signed)
[facility]   [id number] 02/27/22 Arrival Time: 0901  ASSESSMENT & PLAN:  1. Body aches   2. Nausea without vomiting    Suspect heat-related illness. Discussed. Tolerating PO fluids.  Given: Meds ordered this encounter  Medications   ondansetron (ZOFRAN) injection 4 mg   ketorolac (TORADOL) injection 60 mg   Pending: Orders Placed This Encounter  Procedures   TSH   CBC with Differential/Platelet   Comprehensive metabolic panel   CK  Cancelled U/A; pt unable to provided; wouldn't try to collect.    Discharge Instructions      Please do your best to ensure adequate fluid intake in order to avoid dehydration. If you find that you are unable to tolerate drinking fluids regularly please proceed to the Emergency Department for evaluation.  You have had labs (blood work) drawn today. We will call you with any significant abnormalities or if there is need to begin or change treatment or pursue further follow up.  You may also review your test results online through MyChart. If you do not have a MyChart account, instructions to sign up should be on your discharge paperwork.      Recommend:  Follow-up Information     [facility] EMERGENCY DEPARTMENT.   Specialty: Emergency Medicine Why: If symptoms worsen in any way. Contact information: [address] [id number] mc [place] [address] [phone]                 Reviewed expectations re: course of current medical issues. Questions answered. Outlined signs and symptoms indicating need for more acute intervention. Patient verbalized understanding. After Visit Summary given.   SUBJECTIVE: History from: patient.  Wesley Clarke is a 36 y.o. male who reports "just feeling bad" after working in heat yesterday. Body/muscle aches. Fatigued. Nausea without emesis. Afebrile. Sweating a lot this morning. Tolerating PO fluids. No abd/back/chest pains. No new  medications.  Social History   Tobacco Use  Smoking Status Every Day   Packs/day: 1.00   Types: Cigarettes  Smokeless Tobacco Never   Reports quitting; now vapes. Rare THC use. No other recreational drug use.  Past Surgical History:  Procedure Laterality Date   NO PAST SURGERIES     OBJECTIVE:  Vitals:   02/27/22 0950  BP: (!) 150/98  Pulse: 76  Resp: 18  Temp: 98.2 F (36.8 C)  TempSrc: Oral  SpO2: 94%    General appearance: alert; no distress Oropharynx: moist Lungs: respirations unlabored Heart: regular Abdomen: soft Extremities: symmetrical with no gross deformities Skin: warm; dry Neurologic: normal gait Psychological: alert and cooperative; normal mood and affect  No Known Allergies                                             Past Medical History:  Diagnosis Date   ADHD (attention deficit hyperactivity disorder)    Asthma    Epileptic seizures ([facility])    last seizure 1996   Head injury 03/07/2020   fell off ladder   Impulse control disorder    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[facility])    Social History   Socioeconomic History   Marital status: Single    Spouse name: Not on file   Number of children: Not on file   Years of education: Not on file   Highest education level: 9th grade  Occupational History  Comment: Holiday representative  Tobacco Use   Smoking status: Every Day    Packs/day: 1.00    Types: Cigarettes   Smokeless tobacco: Never  Substance and Sexual Activity   Alcohol use: No   Drug use: Not Currently    Types: Marijuana    Comment: rare   Sexual activity: Not on file  Other Topics Concern   Not on file  Social History Narrative   Caffeine- 4 Monsters a day, sodas- amt varies, water, Body Armour w/o caffeine   Social Determinants of Health   Financial Resource Strain: Not on file  Food Insecurity: Not on file  Transportation Needs: Not on file  Physical Activity: Not on file  Stress: Not on file  Social Connections: Not on file  Intimate  Partner Violence: Not on file   Family History  Problem Relation Age of Onset   Hypertension Mother       Mardella Layman, MD 02/27/22 1103

## 2022-05-10 IMAGING — CR DG CHEST 2V
2 series · 2 of 2 positions shown · non-contrast
Comparison: 08/09/2020

CLINICAL DATA: Chest pain

EXAM:
CHEST - 2 VIEW

[chest pa]
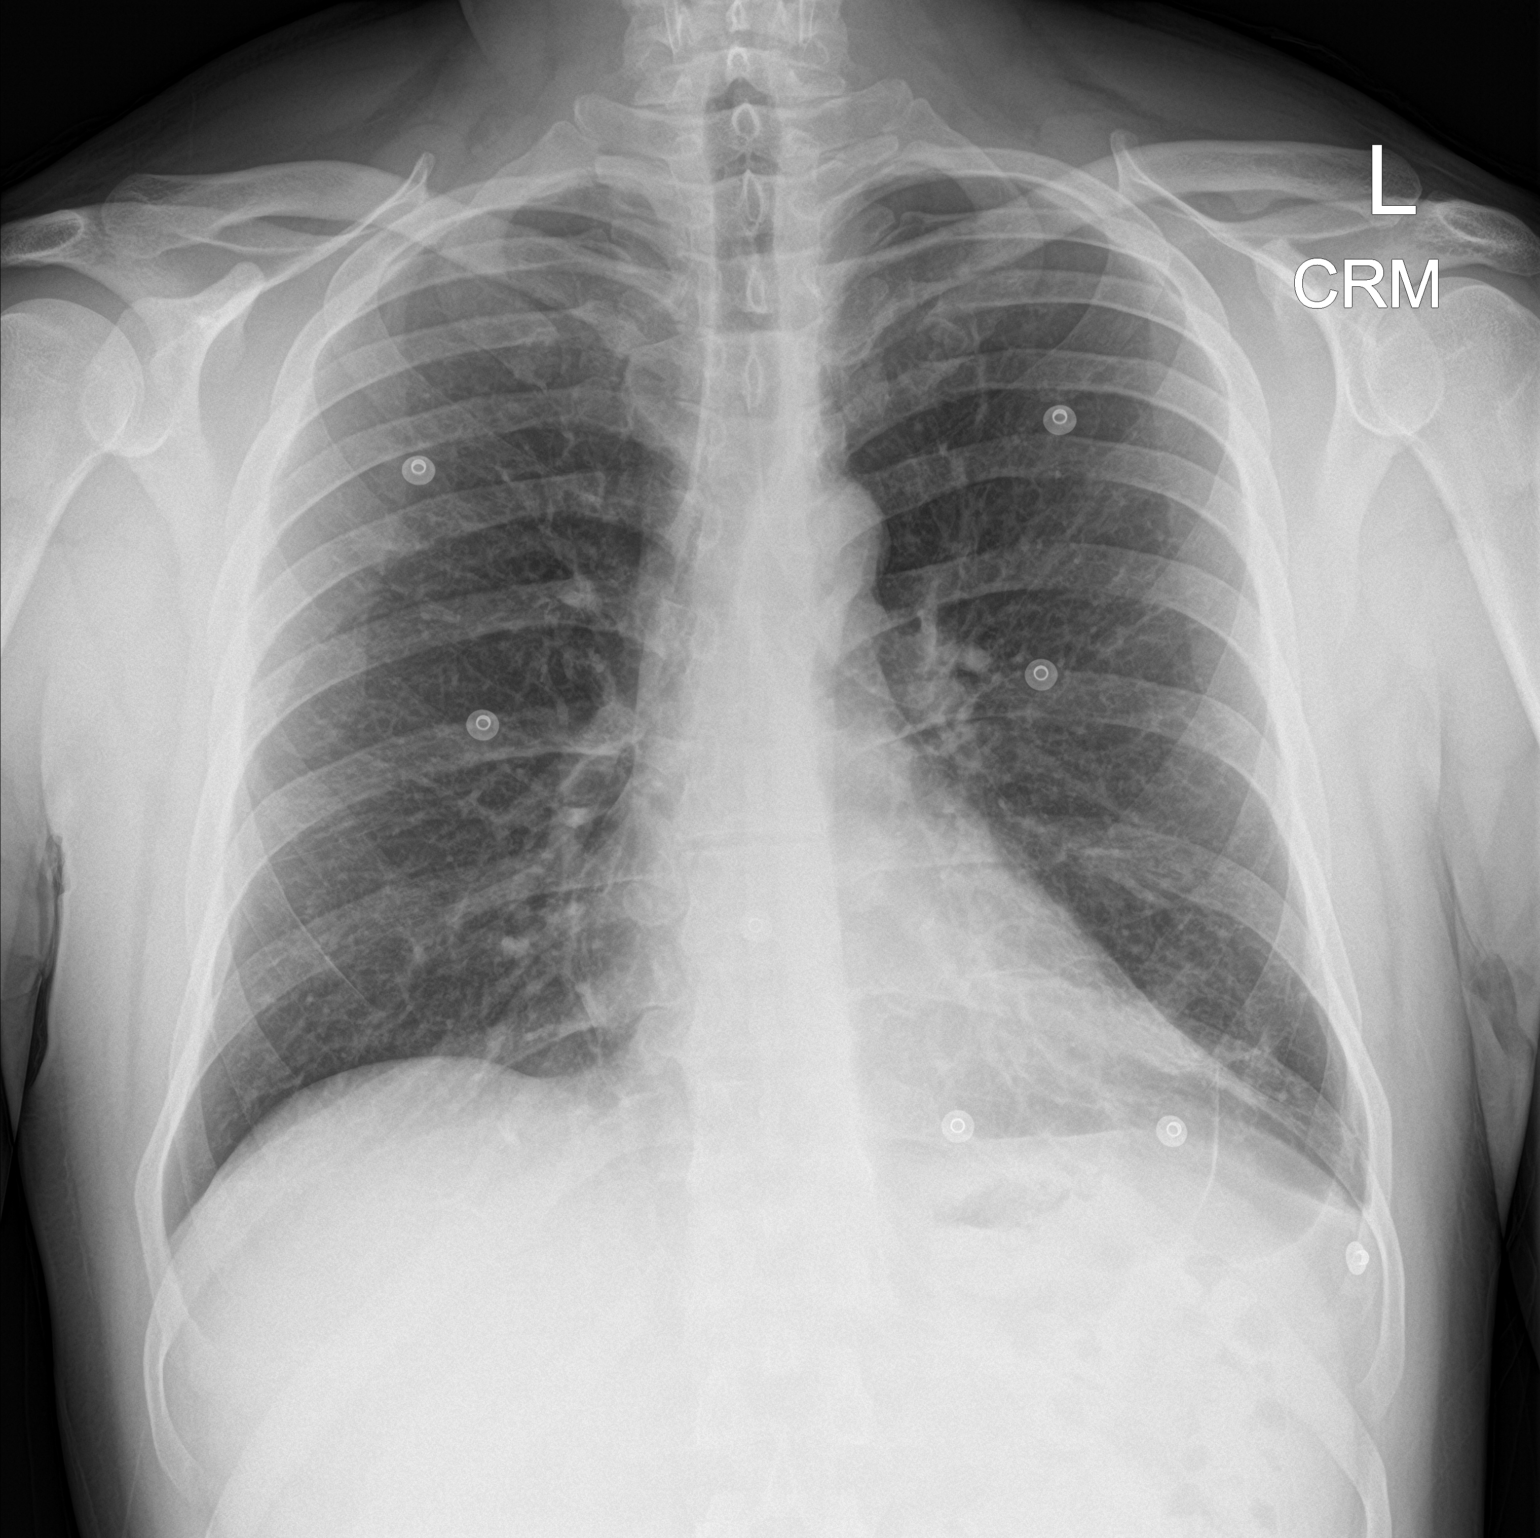

[chest lat]
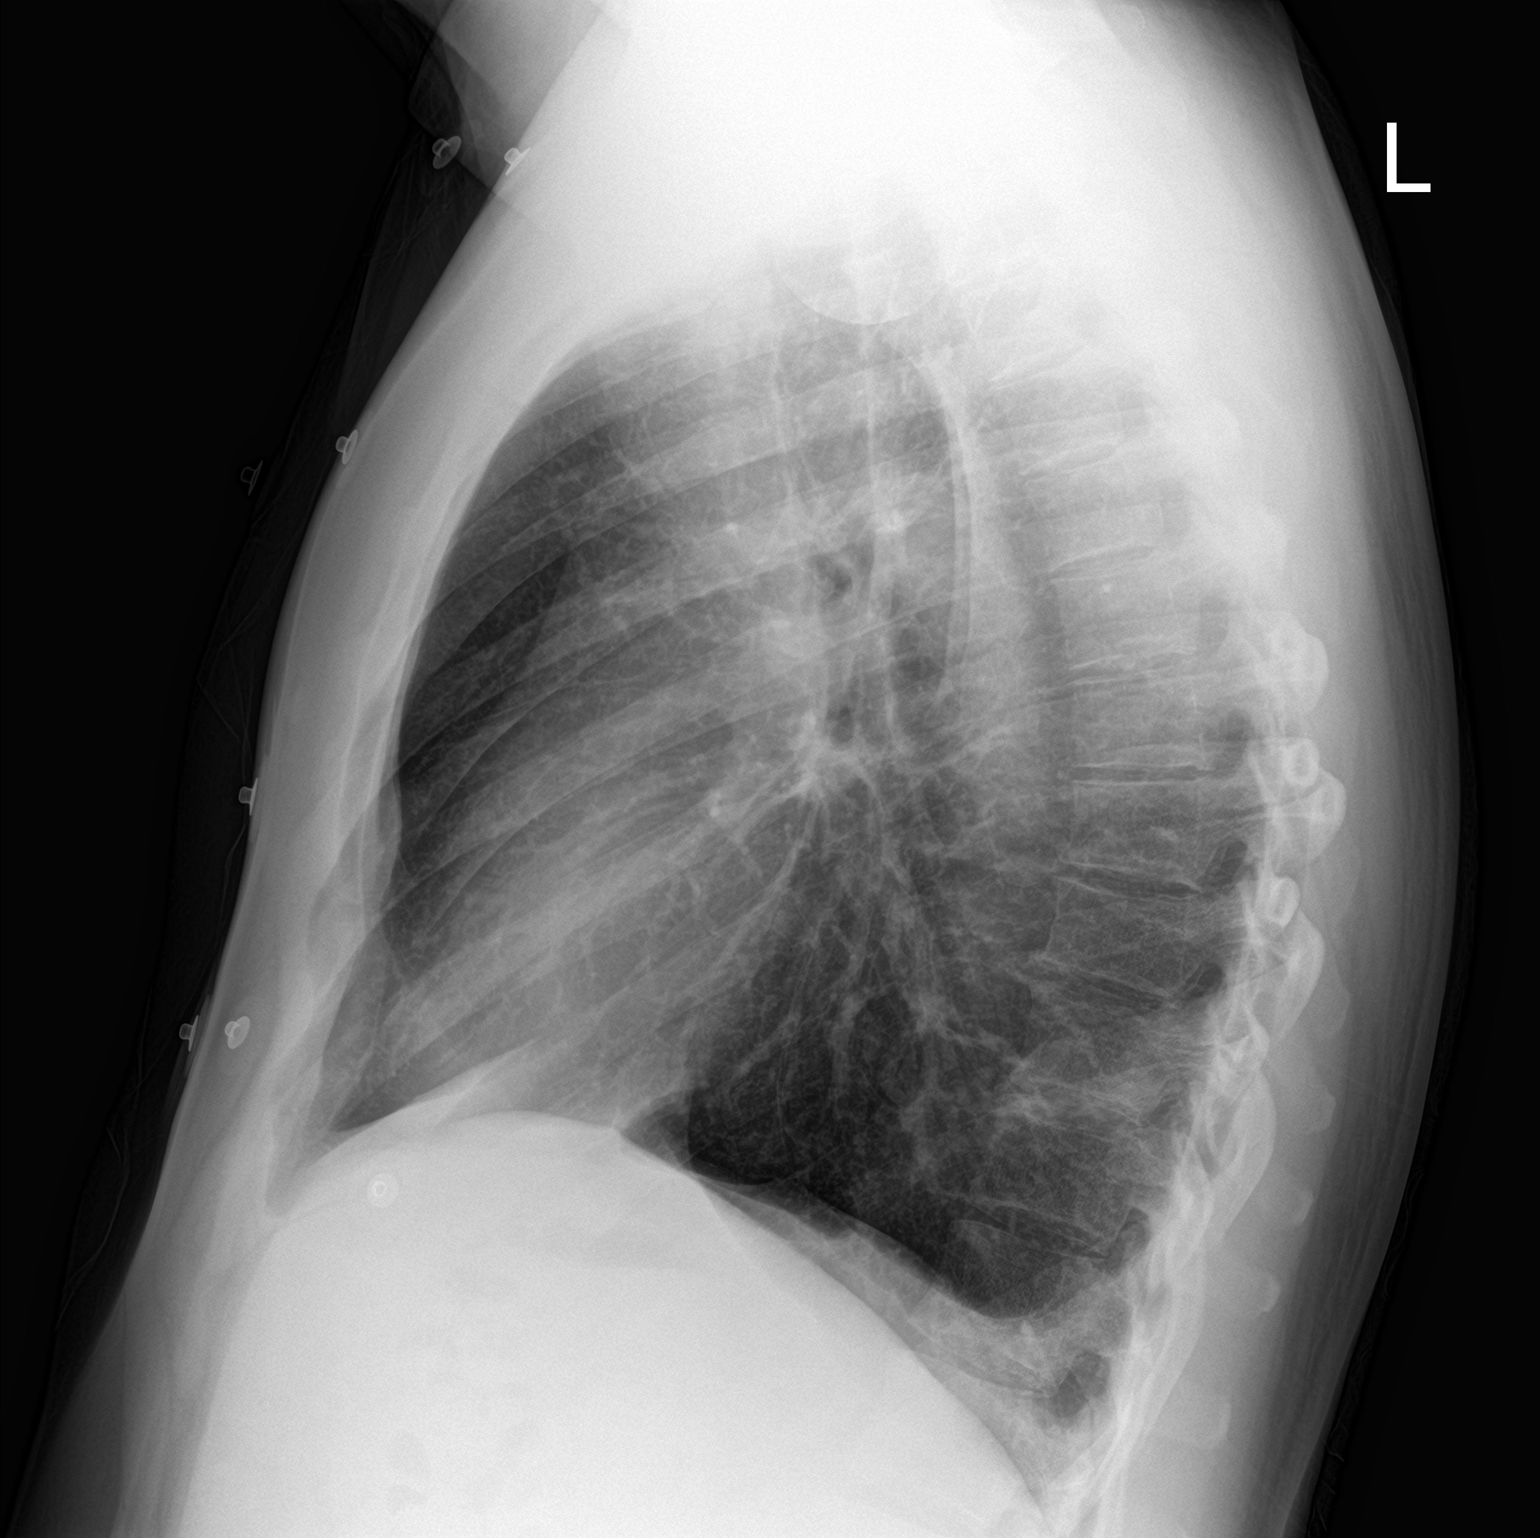

[2 of 2 positions shown; findings below may reference images not displayed]

FINDINGS: Scarring at the left base. Right lung clear. Heart is normal size.
No effusions. No acute bony abnormality.
IMPRESSION: Left basilar scarring, stable.  No active disease.

## 2023-05-11 IMAGING — CT CT ABD-PELV W/O CM
2 of 4 series · 16 of 46 positions shown, 18 images · non-contrast
Comparison: 01/14/2004

CLINICAL DATA: Abdominal pain, acute, nonlocalized.



[Series 3: ap without (person_name) · axial · non-contrast · 0.79mm/px · z∈[+813,+1278]mm · 13 of 107 slices shown, 15 images]
[im 7/107  soft-tissue]
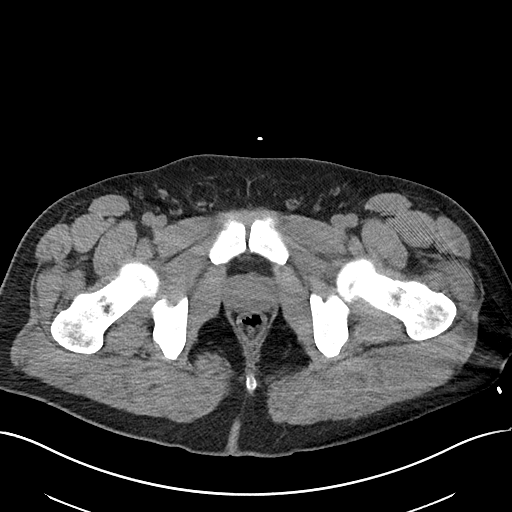
[im 7/107  bone]
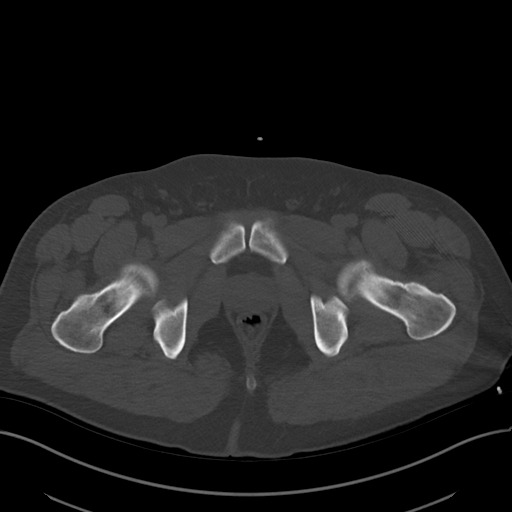
[im 13/107  soft-tissue]
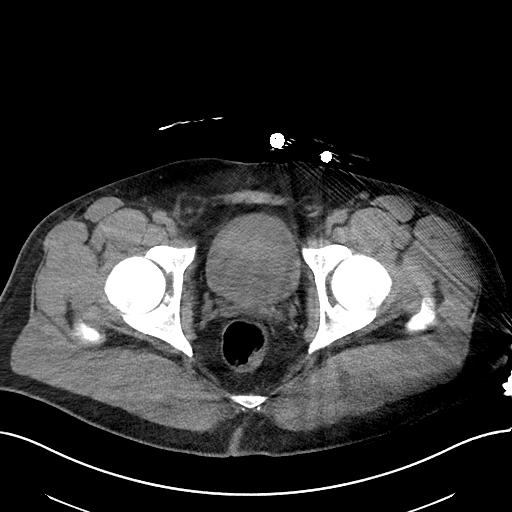
[im 25/107  soft-tissue]
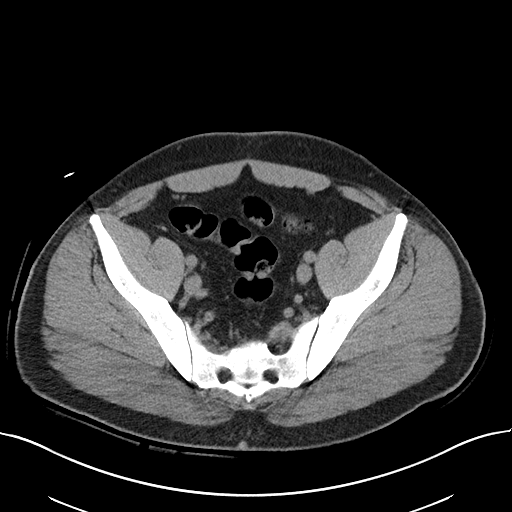
[im 32/107  soft-tissue]
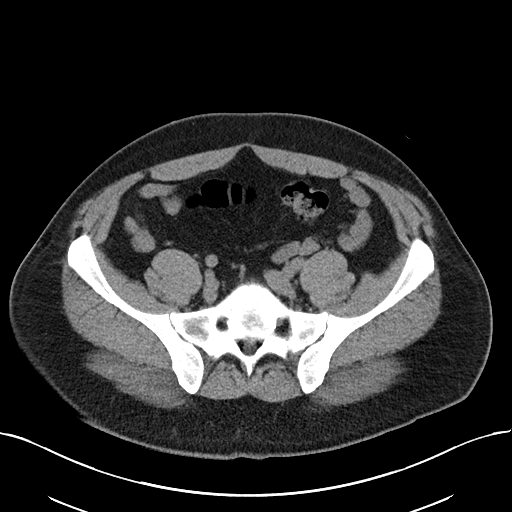
[im 38/107  soft-tissue]
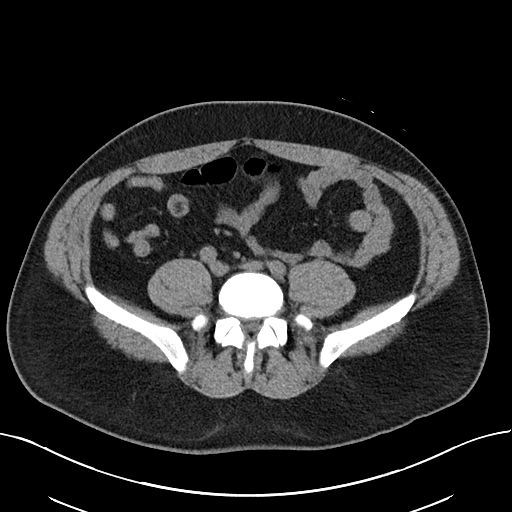
[im 44/107  soft-tissue]
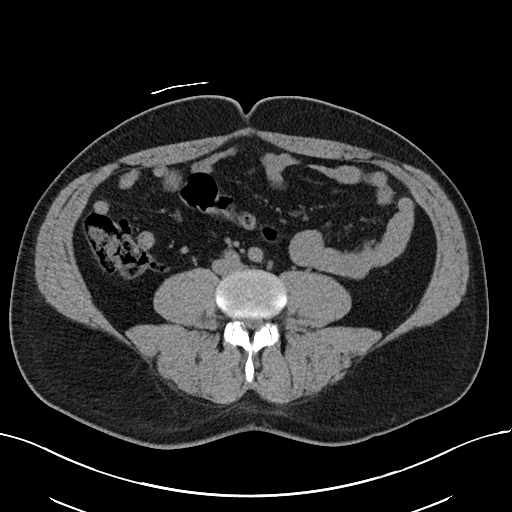
[im 57/107  soft-tissue]
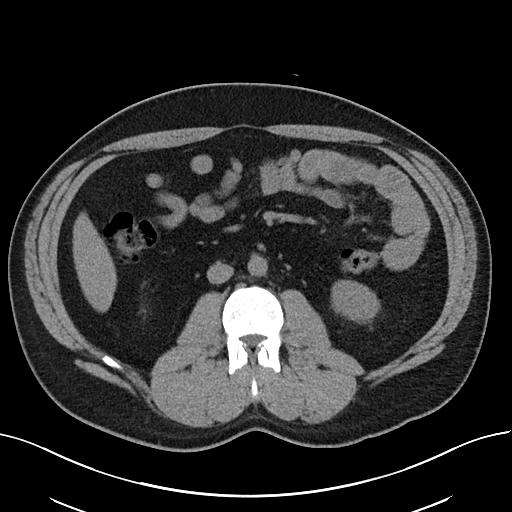
[im 63/107  soft-tissue]
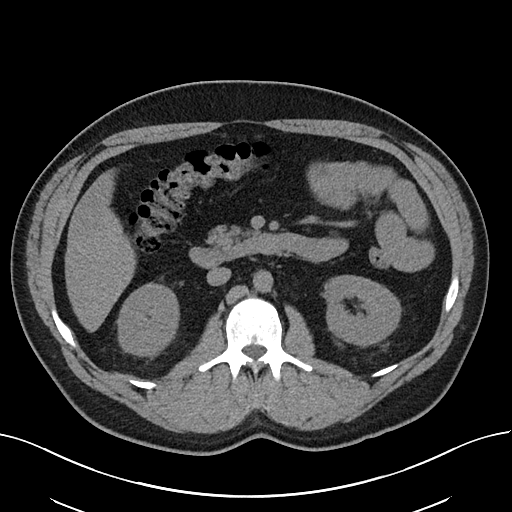
[im 69/107  soft-tissue]
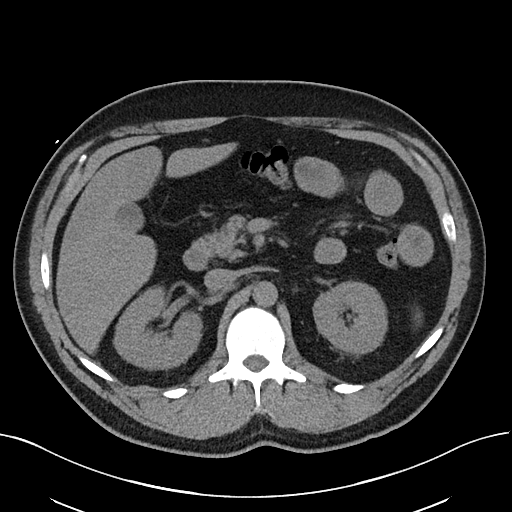
[im 69/107  bone]
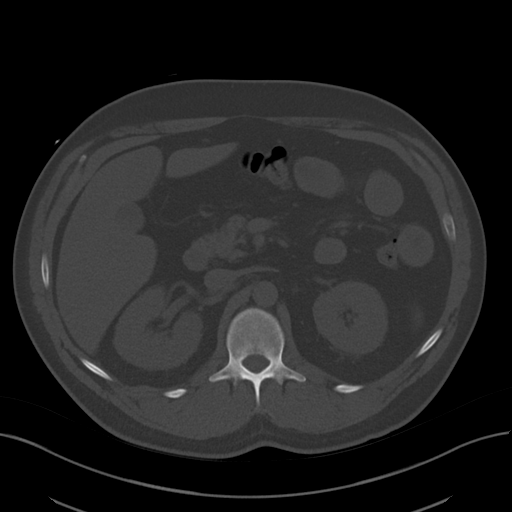
[im 75/107  soft-tissue]
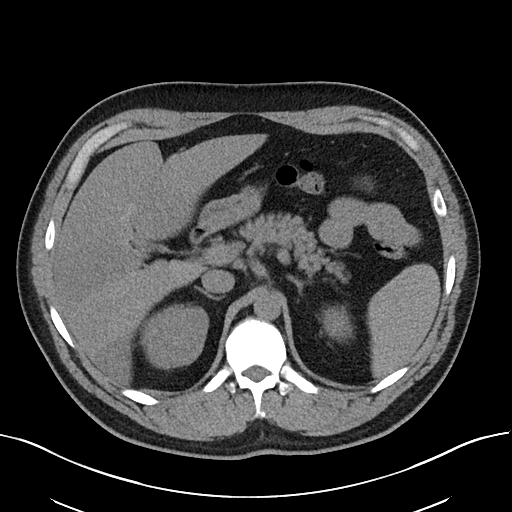
[im 82/107  soft-tissue]
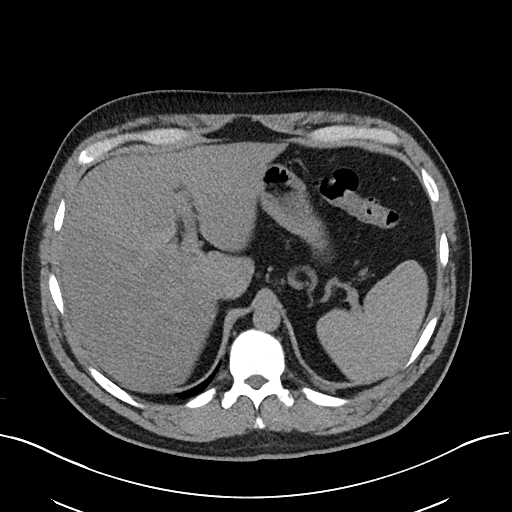
[im 94/107  soft-tissue]
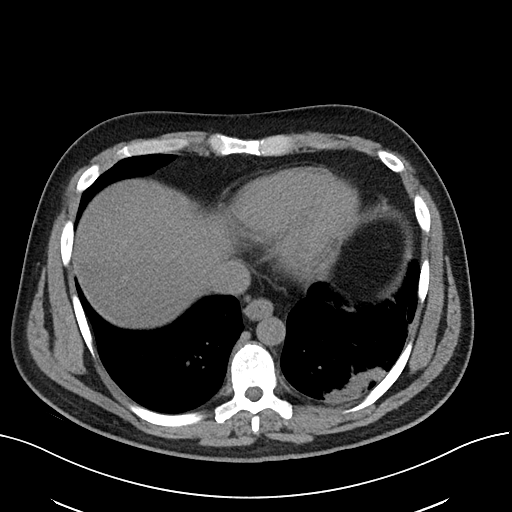
[im 100/107  soft-tissue]
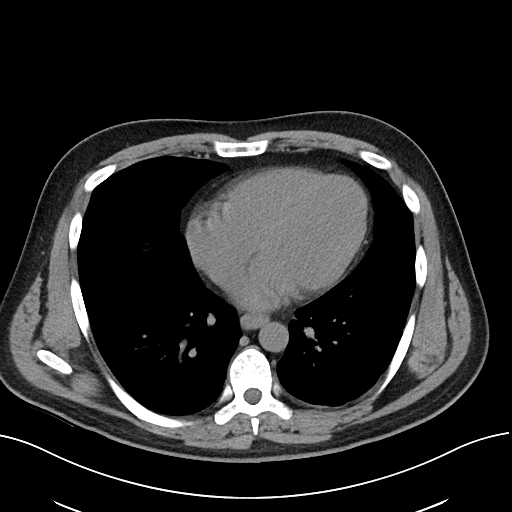

[Series 6: cor · coronal · 0.89mm/px · 3 of 107 slices shown]
[im 36/107  soft-tissue]
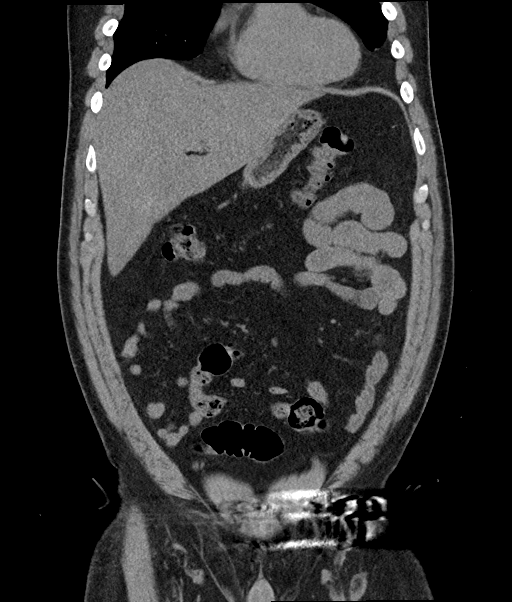
[im 48/107  soft-tissue]
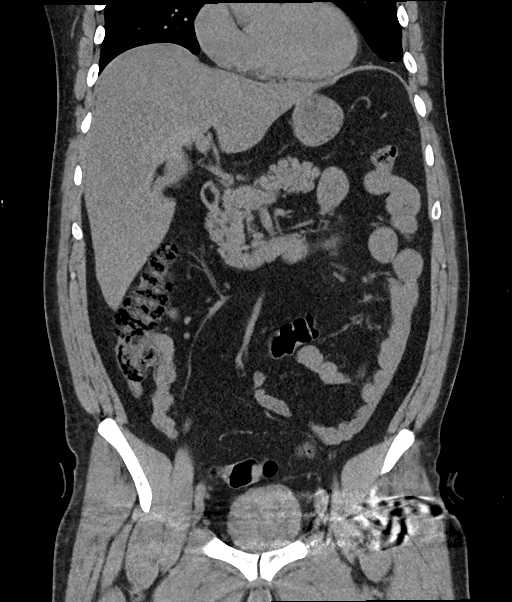
[im 59/107  soft-tissue]
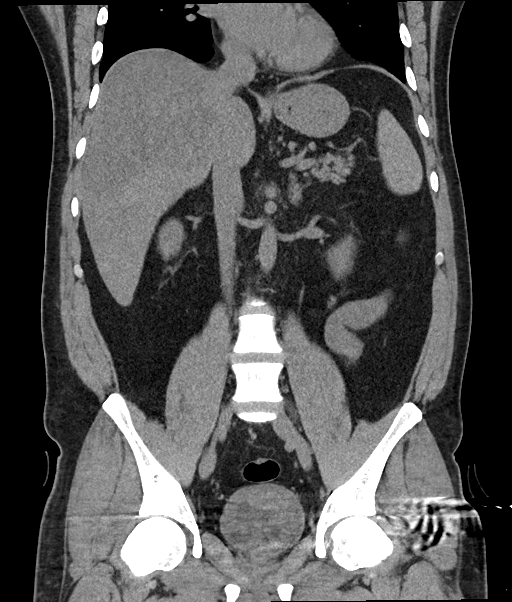

[16 of 46 positions shown; findings below may reference images not displayed]

FINDINGS: Lower chest: Left basilar rounded atelectasis. Visualized right lung
base is clear. Visualized heart and pericardium are unremarkable.

Hepatobiliary: Moderate hepatic steatosis with areas of fatty
sparing within the gallbladder fossa and segment 6. No definite
intrahepatic mass on this noncontrast examination. No intra or
extrahepatic biliary ductal dilation. Gallbladder unremarkable.

Pancreas: Unremarkable

Spleen: Unremarkable

Adrenals/Urinary Tract: The adrenal glands are unremarkable. The
kidneys are normal in size and position. Punctate nonobstructing
calculi are seen within the lower pole of the right kidney.
Nonobstructing calculus within the lower pole the left kidney
measures 6 mm in dimension. No perinephric fluid collections. No
hydronephrosis. No ureteral calculi. The bladder is unremarkable.

Stomach/Bowel: The sigmoid colon is redundant. The stomach, small
bowel, and large bowel are otherwise unremarkable. Appendix normal.
No free intraperitoneal gas or fluid.

Vascular/Lymphatic: No significant vascular findings are present. No
enlarged abdominal or pelvic lymph nodes.

Reproductive: Prostate is unremarkable.

Other: Small fat containing right inguinal hernia. Rectum
unremarkable.

Musculoskeletal: No acute bone abnormality. No lytic or blastic bone
lesion.
IMPRESSION: Mild bilateral nonobstructing nephrolithiasis. No hydronephrosis. No
urolithiasis.

Hepatic steatosis.

## 2023-11-19 ENCOUNTER — Encounter (HOSPITAL_COMMUNITY): Payer: Self-pay | Admitting: Emergency Medicine

## 2023-11-19 ENCOUNTER — Other Ambulatory Visit: Payer: Self-pay

## 2023-11-19 ENCOUNTER — Emergency Department (HOSPITAL_COMMUNITY)
Admission: EM | Admit: 2023-11-19 | Discharge: 2023-11-19 | Disposition: A | Discharge status: Home / Self Care | Payer: Self-pay | Attending: Emergency Medicine | Admitting: Emergency Medicine

## 2023-11-19 DIAGNOSIS — U071 COVID-19: Secondary | ICD-10-CM | POA: Insufficient documentation

## 2023-11-19 LAB — RESP PANEL BY RT-PCR (RSV, FLU A&B, COVID)  RVPGX2
Influenza A by PCR: NEGATIVE
Influenza B by PCR: NEGATIVE
Resp Syncytial Virus by PCR: NEGATIVE
SARS Coronavirus 2 by RT PCR: POSITIVE — AB

## 2023-11-19 NOTE — ED Provider Notes (Signed)
  [facility] EMERG[facility]Y [facility]PARTMENT AT [facility] Provider Note   CSN: [id number] Arrival date & time: 11/19/23  7829     History  Chief Complaint  Patient presents with   Generalized Body Aches   Sore Throat    Wesley Clarke is a 38 y.o. male.  HPI Presents with concern of 3 days of illness.  He notes that prior to this he was generally well.  He vapes, but otherwise has no medical problems. Over the past few days he has had cough, congestion, myalgia.  Fever has improved with Tylenol.    Home Medications Prior to Admission medications   Medication Sig Start Date End Date Taking? Authorizing Provider  ondansetron (ZOFRAN-ODT) 4 MG disintegrating tablet Take 1 tablet (4 mg total) by mouth every 8 (eight) hours as needed for nausea or vomiting. 02/27/22   Mardella Layman, MD      Allergies    Patient has no known allergies.    Review of Systems   Review of Systems  Physical Exam Updated Vital Signs BP (!) 141/95   Pulse 93   Temp 97.8 F (36.6 C)   Resp 17   Ht 5\' 11"  (1.803 m)   Wt 91 kg   SpO2 100%   BMI 27.98 kg/m  Physical Exam Vitals and nursing note reviewed.  Constitutional:      General: He is not in acute distress.    Appearance: He is well-developed.  HENT:     Head: Normocephalic and atraumatic.  Eyes:     Conjunctiva/sclera: Conjunctivae normal.  Cardiovascular:     Rate and Rhythm: Normal rate and regular rhythm.  Pulmonary:     Effort: Pulmonary effort is normal. No respiratory distress.     Breath sounds: No stridor.  Abdominal:     General: There is no distension.  Skin:    General: Skin is warm and dry.  Neurological:     Mental Status: He is alert and oriented to person, place, and time.     ED Results / Procedures / Treatments   Labs (all labs ordered are listed, but only abnormal results are displayed) Labs Reviewed  RESP PANEL BY RT-PCR (RSV, FLU A&B, COVID)  RVPGX2 - Abnormal; Notable for the following components:       Result Value   SARS Coronavirus 2 by RT PCR POSITIVE (*)    All other components within normal limits    EKG None  Radiology No results found.  Procedures Procedures    Medications Ordered in ED Medications - No data to display  ED Course/ Medical Decision Making/ A&P                                 Medical Decision Making Patient presents with 3 days of cough, congestion, fever.  Patient in no distress, hemodynamically unremarkable, no increased work of breathing, found to be COVID-positive.  He and I discussed home care, given passage of days since onset is not a candidate for novel antiviral therapy.  Patient discharged in stable condition.    Final Clinical Impression(s) / ED Diagnoses Final diagnoses:  COVID    Rx / DC Orders ED Discharge Orders     None         Gerhard Munch, MD 11/19/23 1338

## 2023-11-19 NOTE — ED Triage Notes (Signed)
 Pt endorses body aches, n/v, sore throat for 3 days. No relief with allergy meds or tylenol.

## 2023-11-19 NOTE — ED Notes (Signed)
 Pt refused discharge vitals

## 2023-12-09 ENCOUNTER — Ambulatory Visit (INDEPENDENT_AMBULATORY_CARE_PROVIDER_SITE_OTHER): Payer: Self-pay

## 2023-12-09 ENCOUNTER — Ambulatory Visit (HOSPITAL_COMMUNITY)
Admission: EM | Admit: 2023-12-09 | Discharge: 2023-12-09 | Disposition: A | Discharge status: Home / Self Care | Payer: Self-pay | Attending: Emergency Medicine | Admitting: Emergency Medicine

## 2023-12-09 ENCOUNTER — Encounter (HOSPITAL_COMMUNITY): Payer: Self-pay

## 2023-12-09 DIAGNOSIS — M25572 Pain in left ankle and joints of left foot: Secondary | ICD-10-CM

## 2023-12-09 DIAGNOSIS — M79605 Pain in left leg: Secondary | ICD-10-CM

## 2023-12-09 DIAGNOSIS — T148XXA Other injury of unspecified body region, initial encounter: Secondary | ICD-10-CM

## 2023-12-09 NOTE — Discharge Instructions (Signed)
 Alternate between 650 mg of Tylenol and 400 to 600 mg of ibuprofen every 4-6 hours as needed for pain. Otherwise alternate between ice and heat to assist with swelling and bruising. The areas should both heal on their own but may take time. Return here if your symptoms persist or worsen.

## 2023-12-09 NOTE — ED Provider Notes (Signed)
 [facility]    CSN: [id number] Arrival date & time: 12/09/23  1656      History   Chief Complaint Chief Complaint  Patient presents with   Leg Pain    HPI Wesley Clarke is a 38 y.o. male.   Patient presents with an area of left lower leg swelling that is painful to touch after a metal pipe fell and hit his leg while he was at work on 4/1.   Patient reports left foot bruising and tenderness that began yesterday. Patient denies any impact from the pipe to his foot. Denies difficulty bearing weight.   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 Authorizing Provider  ondansetron (ZOFRAN-ODT) 4 MG disintegrating tablet Take 1 tablet (4 mg total) by mouth every 8 (eight) hours as needed for nausea or vomiting. 02/27/22   Mardella Layman, MD    Family History Family History  Problem Relation Age of Onset   Hypertension Mother     Social History Social History   Tobacco Use   Smoking status: Every Day    Current packs/day: 1.00    Types: Cigarettes   Smokeless tobacco: Never  Substance Use Topics   Alcohol use: No   Drug use: Not Currently    Types: Marijuana    Comment: rare     Allergies   Patient has no known allergies.   Review of  Systems Review of Systems  Per HPI  Physical Exam Triage Vital Signs ED Triage Vitals  Encounter Vitals Group     BP 12/09/23 1727 (!) 168/116     Systolic BP Percentile --      Diastolic BP Percentile --      Pulse Rate 12/09/23 1727 (!) 102     Resp 12/09/23 1727 18     Temp 12/09/23 1727 98.2 F (36.8 C)     Temp Source 12/09/23 1727 Oral     SpO2 12/09/23 1727 96 %     Weight --      Height --      Head Circumference --      Peak Flow --      Pain Score 12/09/23 1728 4     Pain Loc --      Pain Education --      Exclude from Growth Chart --    No data found.  Updated Vital Signs BP (!) 168/116 (BP Location: Left Arm)   Pulse (!) 102   Temp 98.2 F (36.8 C) (Oral)   Resp 18   SpO2 96%   Visual Acuity Right Eye Distance:   Left Eye Distance:   Bilateral Distance:    Right Eye Near:  Left Eye Near:    Bilateral Near:     Physical Exam Vitals and nursing note reviewed.  Constitutional:      General: He is awake. He is not in acute distress.    Appearance: Normal appearance. He is well-developed and well-groomed. He is not ill-appearing.  Musculoskeletal:     Left ankle: Swelling present. Tenderness present. Normal range of motion.     Left foot: Tenderness present.  Skin:    Findings: Bruising present.       Neurological:     Mental Status: He is alert.  Psychiatric:        Behavior: Behavior is cooperative.      UC Treatments / Results  Labs (all labs ordered are listed, but only abnormal results are displayed) Labs Reviewed - No data to display  EKG   Radiology DG Tibia/Fibula Left Result Date: 12/09/2023 CLINICAL DATA:  leg pain Pt states a metal pipe fell on his LLE a wk ago at work. States has had a red raised knot and swelling since 30 mins after it happen. Pt c/o bruising to lt foot since yesterday. EXAM: LEFT TIBIA AND FIBULA - 2 VIEW COMPARISON:  None Available. FINDINGS: There is no evidence of fracture or other focal bone lesions.  Diffuse leg and ankle subcutaneus soft tissue edema. IMPRESSION: 1. Diffuse leg and ankle subcutaneus soft tissue edema. 2.  No acute displaced fracture or dislocation. Electronically Signed   By: Tish Frederickson M.D.   On: 12/09/2023 20:59    Procedures Procedures (including critical care time)  Medications Ordered in UC Medications - No data to display  Initial Impression / Assessment and Plan / UC Course  I have reviewed the triage vital signs and the nursing notes.  Pertinent labs & imaging results that were available during my care of the patient were reviewed by me and considered in my medical decision making (see chart for details).     Upon assessment there is a 5 cm x 5 cm area of swelling with tenderness and surrounding bruising consistent with hematoma to the medial aspect of the left lower leg.  There is also diffuse bruising with tenderness noted to the medial aspect of the foot and ankle with some tenderness.  Without decreased range of motion or obvious deformity.  Based on interpretation of x-ray there are no underlying fractures or acute osseous abnormalities.  Radiology report revealed diffuse leg and ankle subcutaneous soft tissue edema without acute displaced fracture or dislocation.  No adjustment to treatment plan at this time.  Recommend alternating Tylenol ibuprofen as needed for pain.  Recommended applying ice and heat to assist with swelling and bruising.  Discussed return precautions. Final Clinical Impressions(s) / UC Diagnoses   Final diagnoses:  Acute left ankle pain  Pain of left lower extremity  Hematoma and contusion     Discharge Instructions      Alternate between 650 mg of Tylenol and 400 to 600 mg of ibuprofen every 4-6 hours as needed for pain. Otherwise alternate between ice and heat to assist with swelling and bruising. The areas should both heal on their own but may take time. Return here if your symptoms persist or worsen.     ED  Prescriptions   None    PDMP not reviewed this encounter.   Wynonia Lawman A, NP 12/09/23 2110

## 2023-12-09 NOTE — ED Triage Notes (Signed)
 Pt states a metal pipe fell on his LLE a wk ago at work. States has had a red raised knot and swelling since 30 mins after it happen. Pt c/o bruising to lt foot since yesterday.

## 2024-07-09 ENCOUNTER — Encounter (HOSPITAL_COMMUNITY): Payer: Self-pay

## 2024-07-09 ENCOUNTER — Ambulatory Visit (HOSPITAL_COMMUNITY)
Admission: EM | Admit: 2024-07-09 | Discharge: 2024-07-09 | Disposition: A | Discharge status: Home / Self Care | Payer: Self-pay | Attending: Nurse Practitioner | Admitting: Nurse Practitioner

## 2024-07-09 DIAGNOSIS — M5442 Lumbago with sciatica, left side: Secondary | ICD-10-CM

## 2024-07-09 MED ORDER — DEXAMETHASONE SOD PHOSPHATE PF 10 MG/ML IJ SOLN: 10.0000 mg | Freq: Once | INTRAMUSCULAR | Status: DC

## 2024-07-09 MED ORDER — CYCLOBENZAPRINE HCL 10 MG PO TABS: 10.0000 mg | ORAL_TABLET | Freq: Two times a day (BID) | ORAL | 0 refills | Status: AC | PRN

## 2024-07-09 MED ORDER — KETOROLAC TROMETHAMINE 60 MG/2ML IM SOLN: 60.0000 mg | Freq: Once | INTRAMUSCULAR | Status: DC

## 2024-07-09 MED ORDER — NAPROXEN 500 MG PO TABS: 500.0000 mg | ORAL_TABLET | Freq: Two times a day (BID) | ORAL | 0 refills | Status: AC

## 2024-07-09 NOTE — ED Provider Notes (Signed)
 [facility]    CSN: [id number] Arrival date & time: 07/09/24  1010      History   Chief Complaint Chief Complaint  Patient presents with   Back Pain    HPI Wesley Clarke is a 38 y.o. male.   Discussed the use of AI scribe software for clinical note transcription with the patient, who gave verbal consent to proceed.   The patient presents with left lower back pain that began suddenly 3-4 days ago without any clear injury, heavy lifting, or identifiable trigger. The pain changes with position and movement-leaning forward provides relief, while walking or stepping awkwardly causes sharp pain. At rest, the pain is described as mild with a pulsating sensation.  The pain radiates down the left leg and has been present for the same duration. This morning, the patient attempted to get out of bed and experienced a brief episode of complete loss of sensation and movement in the leg, causing him to fall to the floor. Sensation returned within seconds, followed by numbness and tingling in the leg. The patient reports one prior similar episode in the past.  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 The patient has taken Motrin  with partial relief of symptoms. He denies bowel or bladder incontinence, hematuria, or groin pain.  The following sections of the patient's history were reviewed and updated as appropriate: allergies, current medications, past family history, past medical history, past social history, past surgical history, and problem list.     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 Authorizing Provider  cyclobenzaprine  (FLEXERIL ) 10 MG tablet Take 1 tablet (10 mg total) by mouth 2 (two) times daily as needed for muscle spasms. 07/09/24  Yes Haylee Mcanany, FNP  naproxen  (NAPROSYN ) 500 MG tablet Take 1 tablet (500 mg total) by mouth 2 (two) times daily with a meal. Take with food to avoid stomach upset. Do not take any additional NSAIDs while on this. You may take tylenol  in addition to this if needed for extra pain relief. 07/09/24  Yes Iola Lukes, FNP    Family History Family History  Problem Relation Age of Onset   Hypertension Mother     Social History Social History   Tobacco Use   Smoking status: Every Day    Current packs/day: 1.00    Types: Cigarettes   Smokeless tobacco: Never  Substance Use Topics   Alcohol use: No   Drug use: Not Currently    Types: Marijuana    Comment: rare     Allergies   Patient has no known allergies.   Review of Systems Review of Systems  Gastrointestinal:        No bowel incontinence   Genitourinary:  Negative for hematuria.       No bladder incontinence   Musculoskeletal:  Positive for back pain (left lower).  Neurological:  Positive for weakness (in left leg) and numbness (and tingling down left leg after sensation of leg returned).  All other systems reviewed and are negative.    Physical Exam Triage Vital Signs ED Triage Vitals  Encounter Vitals Group     BP 07/09/24 1132 (!) 136/97     Girls Systolic BP Percentile --      Girls Diastolic BP Percentile --      Boys Systolic BP Percentile --       Boys Diastolic BP Percentile --      Pulse Rate 07/09/24 1132 82     Resp 07/09/24 1132 18     Temp 07/09/24 1132 98.1 F (36.7 C)     Temp Source 07/09/24 1132 Oral     SpO2 07/09/24 1132 95 %     Weight --      Height --      Head Circumference --      Peak Flow --      Pain Score 07/09/24 1131 7     Pain Loc --      Pain Education --      Exclude from Growth Chart --    No data found.  Updated Vital Signs BP (!) 136/97 (BP Location: Left Arm)   Pulse 82   Temp 98.1 F (36.7 C) (Oral)   Resp 18   SpO2 95%   Visual Acuity Right Eye Distance:   Left Eye Distance:   Bilateral Distance:    Right Eye Near:   Left Eye Near:    Bilateral Near:     Physical Exam Vitals reviewed.  Constitutional:      General: He is not in acute distress.    Appearance: Normal appearance. He is well-developed. He is not ill-appearing, toxic-appearing or diaphoretic.     Comments: Patient was observed sitting comfortably in the exam chair with the left leg tucked underneath while using his phone when I entered the room. He then repositioned himself and sat upright without hesitation, difficulty, or visible discomfort. Movements appeared smooth and unguarded.   HENT:     Head: Normocephalic.     Mouth/Throat:     Mouth: Mucous membranes are moist.  Cardiovascular:     Rate and Rhythm: Normal rate and regular rhythm.  Pulmonary:     Effort: Pulmonary effort is normal.     Breath sounds: Normal breath sounds.  Abdominal:     Palpations: Abdomen is soft.     Tenderness: There is no right CVA tenderness or left CVA tenderness.  Musculoskeletal:        General: Normal range of motion.     Cervical back: Normal, normal range of motion and neck supple.     Thoracic back: Normal.     Lumbar back: Tenderness present. No swelling, deformity, lacerations or spasms. Normal range of motion. Negative right straight leg raise test and negative left straight leg raise test.       Back:      Comments: Patient grimaced with palpation over the left paraspinal region, consistent with localized muscular tenderness. No midline or vertebral tenderness appreciated. Straight leg raise test negative bilaterally. Strength and sensation intact throughout with no focal neurological deficits.  Skin:    General: Skin is  warm and dry.  Neurological:     General: No focal deficit present.     Mental Status: He is alert and oriented to person, place, and time.     Cranial Nerves: Cranial nerves 2-12 are intact.     Sensory: Sensation is intact.     Motor: Motor function is intact. No weakness.     Coordination: Coordination is intact.     Gait: Gait is intact.  Psychiatric:        Speech: Speech normal.        Behavior: Behavior normal.      UC Treatments / Results  Labs (all labs ordered are listed, but only abnormal results are displayed) Labs Reviewed - No data to display  EKG   Radiology No results found.  Procedures Procedures (including critical care time)  Medications Ordered in UC Medications - No data to display  Initial Impression / Assessment and Plan / UC Course  I have reviewed the triage vital signs and the nursing notes.  Pertinent labs & imaging results that were available during my care of the patient were reviewed by me and considered in my medical decision making (see chart for details).     The patient was evaluated for left lower back pain radiating down the left leg, consistent with sciatica. Physical examination revealed no focal neurological deficits. Treatment with Toradol  and Decadron  injections was recommended for symptomatic relief, but the patient declined. Prescriptions for naproxen  and Flexeril  were provided; however, the patient expressed concern about affordability but requested that the prescriptions be sent in case he is able to obtain them. He was advised that over-the-counter NSAIDs may be used as directed on the label if prescriptions cannot  be filled. He should not use any OTC NSAIDs if he is able to the get the prescribed medications. Supportive care measures, including alternating heat or ice, gentle stretching, and activity modification, were reviewed. The patient was instructed to follow up with orthopedics if symptoms persist or worsen and to seek emergency evaluation for severe or worsening pain, new weakness, numbness, or any bowel or bladder changes.  Today's evaluation has revealed no signs of a dangerous process. Discussed diagnosis with patient and/or guardian. Patient and/or guardian aware of their diagnosis, possible red flag symptoms to watch out for and need for close follow up. Patient and/or guardian understands verbal and written discharge instructions. Patient and/or guardian comfortable with plan and disposition.  Patient and/or guardian has a clear mental status at this time, good insight into illness (after discussion and teaching) and has clear judgment to make decisions regarding their care  Documentation was completed with the aid of voice recognition software. Transcription may contain typographical errors.   Final Clinical Impressions(s) / UC Diagnoses   Final diagnoses:  Acute left-sided low back pain with left-sided sciatica     Discharge Instructions      You were seen today for low back pain with symptoms consistent with sciatica. Your exam did not show any concerning neurological findings. You were offered injections in the clinic to help reduce inflammation and pain but declined. You have also been prescribed naproxen  and Flexeril ; take these as directed. Do not take any additional over-the-counter NSAIDs such as ibuprofen  or Aleve  while on naproxen , but you may take Tylenol  if you need extra pain relief. Supportive measures such as using heat or ice, resting as needed, avoiding heavy lifting, and gentle stretching can help with your recovery. Most cases improve over time with these treatments.  Follow  up with your primary care provider or an orthopedic specialist if your pain does not improve, continues to interfere with daily activities, or worsens. Go to the emergency department right away if you develop sudden severe pain, new or worsening weakness, numbness, loss of sensation, loss of bowel or bladder control, or difficulty walking.      ED Prescriptions     Medication Sig Dispense Auth. Provider   naproxen  (NAPROSYN ) 500 MG tablet Take 1 tablet (500 mg total) by mouth 2 (two) times daily with a meal. Take with food to avoid stomach upset. Do not take any additional NSAIDs while on this. You may take tylenol  in addition to this if needed for extra pain relief. 20 tablet Iola Lukes, FNP   cyclobenzaprine  (FLEXERIL ) 10 MG tablet Take 1 tablet (10 mg total) by mouth 2 (two) times daily as needed for muscle spasms. 20 tablet Iola Lukes, FNP      PDMP not reviewed this encounter.   Iola Lukes, [place] 07/09/24 [phone]

## 2024-07-09 NOTE — Discharge Instructions (Addendum)
 You were seen today for low back pain with symptoms consistent with sciatica. Your exam did not show any concerning neurological findings. You were offered injections in the clinic to help reduce inflammation and pain but declined. You have also been prescribed naproxen  and Flexeril ; take these as directed. Do not take any additional over-the-counter NSAIDs such as ibuprofen  or Aleve  while on naproxen , but you may take Tylenol  if you need extra pain relief. Supportive measures such as using heat or ice, resting as needed, avoiding heavy lifting, and gentle stretching can help with your recovery. Most cases improve over time with these treatments. Follow up with your primary care provider or an orthopedic specialist if your pain does not improve, continues to interfere with daily activities, or worsens. Go to the emergency department right away if you develop sudden severe pain, new or worsening weakness, numbness, loss of sensation, loss of bowel or bladder control, or difficulty walking.

## 2024-07-09 NOTE — ED Triage Notes (Addendum)
 Pt c/o lower back pain radiating down lt leg x2 days. Took ibuprofen  last night and this morning with little relief. States went to get up this am and fell d/t no feeling in lt leg. States will need a work note. Denies injury.
